# Patient Record
Sex: Female | Born: 1955 | Race: White | Hispanic: No | Smoking: Current every day smoker
Health system: Southern US, Community
[De-identification: ages and names within clinical notes are randomized; demographics above are authoritative.]

## PROBLEM LIST (undated history)

## (undated) DIAGNOSIS — K5792 Diverticulitis of intestine, part unspecified, without perforation or abscess without bleeding: Secondary | ICD-10-CM

## (undated) DIAGNOSIS — I10 Essential (primary) hypertension: Secondary | ICD-10-CM

## (undated) DIAGNOSIS — F32A Depression, unspecified: Secondary | ICD-10-CM

## (undated) DIAGNOSIS — K589 Irritable bowel syndrome without diarrhea: Secondary | ICD-10-CM

## (undated) DIAGNOSIS — J302 Other seasonal allergic rhinitis: Secondary | ICD-10-CM

## (undated) DIAGNOSIS — E785 Hyperlipidemia, unspecified: Secondary | ICD-10-CM

## (undated) DIAGNOSIS — F419 Anxiety disorder, unspecified: Secondary | ICD-10-CM

## (undated) DIAGNOSIS — Z720 Tobacco use: Secondary | ICD-10-CM

## (undated) DIAGNOSIS — I639 Cerebral infarction, unspecified: Secondary | ICD-10-CM

## (undated) DIAGNOSIS — F319 Bipolar disorder, unspecified: Secondary | ICD-10-CM

## (undated) DIAGNOSIS — F29 Unspecified psychosis not due to a substance or known physiological condition: Secondary | ICD-10-CM

## (undated) HISTORY — PX: CHOLECYSTECTOMY: SHX55

## (undated) HISTORY — PX: CARPAL TUNNEL RELEASE: SHX101

---

## 2010-05-20 ENCOUNTER — Emergency Department: Payer: Self-pay | Admitting: Emergency Medicine

## 2015-08-04 ENCOUNTER — Emergency Department
Admission: EM | Admit: 2015-08-04 | Discharge: 2015-08-04 | Disposition: A | Discharge status: Home / Self Care | Payer: Self-pay | Attending: Emergency Medicine | Admitting: Emergency Medicine

## 2015-08-04 ENCOUNTER — Encounter: Payer: Self-pay | Admitting: Emergency Medicine

## 2015-08-04 DIAGNOSIS — W5501XA Bitten by cat, initial encounter: Secondary | ICD-10-CM | POA: Insufficient documentation

## 2015-08-04 DIAGNOSIS — Y9389 Activity, other specified: Secondary | ICD-10-CM | POA: Insufficient documentation

## 2015-08-04 DIAGNOSIS — Y998 Other external cause status: Secondary | ICD-10-CM | POA: Insufficient documentation

## 2015-08-04 DIAGNOSIS — Z23 Encounter for immunization: Secondary | ICD-10-CM | POA: Insufficient documentation

## 2015-08-04 DIAGNOSIS — F17200 Nicotine dependence, unspecified, uncomplicated: Secondary | ICD-10-CM | POA: Insufficient documentation

## 2015-08-04 DIAGNOSIS — Y9289 Other specified places as the place of occurrence of the external cause: Secondary | ICD-10-CM | POA: Insufficient documentation

## 2015-08-04 DIAGNOSIS — S61231A Puncture wound without foreign body of left index finger without damage to nail, initial encounter: Secondary | ICD-10-CM | POA: Insufficient documentation

## 2015-08-04 DIAGNOSIS — S61251A Open bite of left index finger without damage to nail, initial encounter: Secondary | ICD-10-CM | POA: Insufficient documentation

## 2015-08-04 HISTORY — DX: Other seasonal allergic rhinitis: J30.2

## 2015-08-04 HISTORY — DX: Diverticulitis of intestine, part unspecified, without perforation or abscess without bleeding: K57.92

## 2015-08-04 HISTORY — DX: Irritable bowel syndrome, unspecified: K58.9

## 2015-08-04 MED ORDER — AMOXICILLIN-POT CLAVULANATE 875-125 MG PO TABS
1.0000 | ORAL_TABLET | Freq: Once | ORAL | Status: AC
  Administered 2015-08-04: 1 via ORAL
  Filled 2015-08-04: qty 1

## 2015-08-04 MED ORDER — AMOXICILLIN-POT CLAVULANATE 875-125 MG PO TABS: 1.0000 | ORAL_TABLET | Freq: Two times a day (BID) | ORAL | Status: AC

## 2015-08-04 MED ORDER — TETANUS-DIPHTH-ACELL PERTUSSIS 5-2.5-18.5 LF-MCG/0.5 IM SUSP
0.5000 mL | Freq: Once | INTRAMUSCULAR | Status: AC
  Administered 2015-08-04: 0.5 mL via INTRAMUSCULAR
  Filled 2015-08-04: qty 0.5

## 2015-08-04 NOTE — ED Notes (Signed)
[place] [employer] at front desk made aware and will email animal control

## 2015-08-04 NOTE — ED Notes (Signed)
Bite rinsed with NS and covered with gauze.

## 2015-08-04 NOTE — Discharge Instructions (Signed)
Please return if you develop worsened pain, swelling, redness or purulent drainage from your finger.

## 2015-08-04 NOTE — ED Provider Notes (Signed)
[facility] Emergency Department Provider Note  ____________________________________________  Time seen: Approximately 2310 PM  I have reviewed the triage vital signs and the nursing notes.   HISTORY  Chief Complaint Animal Bite    HPI Robin Mosley is a 59 y.o. female who comes into the hospital today with an animal bite. The patient reports that she was bitten by her mother's neighbor's cat. She reports that she has pet the cat in the past but today when she went to pet the cat Bit her finger. She reports that this occurred out of the blue. The patient reports that since it is a pet that she assumes it has all of that shots but she is unsure. The patient reports that she's having some swelling to her left index finger and her pain is a 6 out of 10 in intensity. The patient ran her finger under cold water but did not place any ice on it. The patient's last tetanus was 11-12 years ago and she does not notice any significant swelling. The patient comes in for evaluation and reports that she did speak to the police about the animal.   Past Medical History  Diagnosis Date  . Seasonal allergies   . Diverticulitis   . IBS (irritable bowel syndrome)     There are no active problems to display for this patient.   Past Surgical History  Procedure Laterality Date  . Cesarean section    . Carpal tunnel release    . Cholecystectomy      Current Outpatient Rx  Name  Route  Sig  Dispense  Refill  . amoxicillin-clavulanate (AUGMENTIN) 875-125 MG tablet   Oral   Take 1 tablet by mouth 2 (two) times daily.   20 tablet   0     Allergies Sulfa antibiotics and Tetracyclines & related  History reviewed. No pertinent family history.  Social History Social History  Substance Use Topics  . Smoking status: Current Every Day Smoker  . Smokeless tobacco: None  . Alcohol Use: Yes     Comment: social    Review of Systems Constitutional: No fever/chills Eyes:  No visual changes. ENT: No sore throat. Cardiovascular: Denies chest pain. Respiratory: Denies shortness of breath. Gastrointestinal: No abdominal pain.  No nausea, no vomiting.  No diarrhea.  No constipation. Genitourinary: Negative for dysuria. Musculoskeletal: Finger injury Skin: Negative for rash. Neurological: Negative for headaches, focal weakness or numbness.  10-point ROS otherwise negative.  ____________________________________________   PHYSICAL EXAM:  VITAL SIGNS: ED Triage Vitals  Enc Vitals Group     BP 08/04/15 1924 186/72 mmHg     Pulse Rate 08/04/15 1924 81     Resp 08/04/15 1924 20     Temp 08/04/15 1924 97.8 F (36.6 C)     Temp Source 08/04/15 1924 Oral     SpO2 08/04/15 1924 96 %     Weight 08/04/15 1924 135 lb (61.236 kg)     Height 08/04/15 1924 5\' 2"  (1.575 m)     Head Cir --      Peak Flow --      Pain Score 08/04/15 1925 4     Pain Loc --      Pain Edu? --      Excl. in GC? --     Constitutional: Alert and oriented. Well appearing and in mild distress. Eyes: Conjunctivae are normal. PERRL. EOMI. Head: Atraumatic. Nose: No congestion/rhinnorhea. Mouth/Throat: Mucous membranes are moist.  Oropharynx non-erythematous. Cardiovascular: Normal  rate, regular rhythm. Grossly normal heart sounds.  Good peripheral circulation. Respiratory: Normal respiratory effort.  No retractions. Lungs CTAB. Gastrointestinal: Soft and nontender. No distention. Positive bowel sounds  Musculoskeletal: No lower extremity tenderness nor edema.  Mild swelling to left index finger. Neurologic:  Normal speech and language.  Skin:  Puncture wound noted to left index proximal phalanx Psychiatric: Mood and affect are normal.   ____________________________________________   LABS (all labs ordered are listed, but only abnormal results are displayed)  Labs Reviewed - No data to  display ____________________________________________  EKG  None ____________________________________________  RADIOLOGY  None ____________________________________________   PROCEDURES  Procedure(s) performed: None  Critical Care performed: No  ____________________________________________   INITIAL IMPRESSION / ASSESSMENT AND PLAN / ED COURSE  Pertinent labs & imaging results that were available during my care of the patient were reviewed by me and considered in my medical decision making (see chart for details).  The patient's wound was washed out by the nurse with normal saline. I ordered a T data for the patient as well as some Augmentin. The patient appears well in her finger does not look significantly swollen warm or severely tender. I did inform the patient of return precautions and will give the patient some Augmentin now as well as a prescription for Augmentin. The patient will be discharged home to follow-up with the acute care clinic or return if she should have any worsening symptoms or signs of infection to her finger. The bite did not cross the joint space. ____________________________________________   FINAL CLINICAL IMPRESSION(S) / ED DIAGNOSES  Final diagnoses:  Cat bite, initial encounter      Rebecka Apley, MD 08/05/15 0006

## 2015-08-04 NOTE — ED Notes (Signed)
pt reports being bit by neighbors cat PTA puncture wound to first finger of left hand

## 2015-08-05 NOTE — ED Notes (Signed)
Pt discharged to home.  Pt in NAD.  Discharge instructions reviewed.  No questions or concerns at this time.  No items left in ED.  Teach back verified.  Work note provided.

## 2016-05-01 ENCOUNTER — Emergency Department
Admission: EM | Admit: 2016-05-01 | Discharge: 2016-05-01 | Disposition: A | Discharge status: Home / Self Care | Payer: No Typology Code available for payment source | Attending: Emergency Medicine | Admitting: Emergency Medicine

## 2016-05-01 ENCOUNTER — Emergency Department: Payer: No Typology Code available for payment source

## 2016-05-01 ENCOUNTER — Encounter: Payer: Self-pay | Admitting: Intensive Care

## 2016-05-01 DIAGNOSIS — M549 Dorsalgia, unspecified: Secondary | ICD-10-CM | POA: Diagnosis not present

## 2016-05-01 DIAGNOSIS — Y999 Unspecified external cause status: Secondary | ICD-10-CM | POA: Diagnosis not present

## 2016-05-01 DIAGNOSIS — M1990 Unspecified osteoarthritis, unspecified site: Secondary | ICD-10-CM

## 2016-05-01 DIAGNOSIS — F17200 Nicotine dependence, unspecified, uncomplicated: Secondary | ICD-10-CM | POA: Diagnosis not present

## 2016-05-01 DIAGNOSIS — Y9389 Activity, other specified: Secondary | ICD-10-CM | POA: Diagnosis not present

## 2016-05-01 DIAGNOSIS — Y92410 Unspecified street and highway as the place of occurrence of the external cause: Secondary | ICD-10-CM | POA: Insufficient documentation

## 2016-05-01 DIAGNOSIS — M4692 Unspecified inflammatory spondylopathy, cervical region: Secondary | ICD-10-CM | POA: Insufficient documentation

## 2016-05-01 DIAGNOSIS — R51 Headache: Secondary | ICD-10-CM | POA: Diagnosis present

## 2016-05-01 MED ORDER — CYCLOBENZAPRINE HCL 10 MG PO TABS: 10.0000 mg | ORAL_TABLET | Freq: Three times a day (TID) | ORAL | 0 refills | Status: DC | PRN

## 2016-05-01 MED ORDER — IBUPROFEN 600 MG PO TABS: 600.0000 mg | ORAL_TABLET | Freq: Three times a day (TID) | ORAL | 0 refills | Status: DC | PRN

## 2016-05-01 MED ORDER — OXYCODONE-ACETAMINOPHEN 5-325 MG PO TABS
2.0000 | ORAL_TABLET | Freq: Once | ORAL | Status: AC
  Administered 2016-05-01: 2 via ORAL
  Filled 2016-05-01: qty 2
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
## 2019-03-27 ENCOUNTER — Other Ambulatory Visit: Payer: Self-pay

## 2019-03-27 ENCOUNTER — Encounter: Payer: Self-pay | Admitting: Emergency Medicine

## 2019-03-27 ENCOUNTER — Ambulatory Visit
Admission: EM | Admit: 2019-03-27 | Discharge: 2019-03-27 | Disposition: A | Discharge status: Home / Self Care | Payer: Self-pay | Attending: Family Medicine | Admitting: Family Medicine

## 2019-03-27 DIAGNOSIS — X500XXA Overexertion from strenuous movement or load, initial encounter: Secondary | ICD-10-CM

## 2019-03-27 DIAGNOSIS — M5440 Lumbago with sciatica, unspecified side: Secondary | ICD-10-CM

## 2019-03-27 MED ORDER — TIZANIDINE HCL 4 MG PO TABS: 4.0000 mg | ORAL_TABLET | Freq: Three times a day (TID) | ORAL | 0 refills | Status: DC | PRN

## 2019-03-27 MED ORDER — MELOXICAM 15 MG PO TABS: 15.0000 mg | ORAL_TABLET | Freq: Every day | ORAL | 0 refills | Status: DC | PRN

## 2019-03-27 NOTE — ED Triage Notes (Signed)
Pt c/o right hip pain and radiates down her entire right leg. Started yesterday. She thinks she may have pulled something when she was lifting heavy boxes at work. She states that she could hardly walk this morning.

## 2019-03-27 NOTE — Discharge Instructions (Signed)
Rest.  Ice and Heat.  Medication as prescribed.  Take care  Dr. Ahnika Hannibal  

## 2019-03-29 ENCOUNTER — Telehealth: Payer: Self-pay | Admitting: Emergency Medicine

## 2019-03-29 NOTE — ED Provider Notes (Signed)
[facility]    CSN: [id number] Arrival date & time: 03/27/19  1327  History   Chief Complaint Chief Complaint  Patient presents with  . Hip Pain    right   HPI  63 year old female presents with the above complaint.  Patient reports that she developed what she describes as right hip pain yesterday.  He attributes this to lifting heavy boxes at work.  Patient having difficulty walking secondary to pain.  She currently rates her pain is 4/10 in severity.  Patient reports that the pain radiates down her right leg.  She has taken Motrin without resolution.  No other medications or interventions tried.  Exacerbated by activity.  No other complaints.  PMH, Surgical Hx, Family Hx, Social History reviewed and updated as below.  Past Medical History:  Diagnosis Date  . Diverticulitis   . IBS (irritable bowel syndrome)   . Seasonal allergies    Past Surgical History:  Procedure Laterality Date  . CARPAL TUNNEL RELEASE    . CESAREAN SECTION    . CHOLECYSTECTOMY     OB History   No obstetric history on file.    Home Medications    Prior to Admission medications   Medication Sig Start Date End Date Taking? Authorizing Provider  diphenhydrAMINE (BENADRYL) 25 MG tablet Take 25 mg by mouth every 6 (six) hours as needed.   Yes [provider]  meloxicam (MOBIC) 15 MG tablet Take 1 tablet (15 mg total) by mouth daily as needed for pain. 03/27/19   Coral Spikes, DO  tiZANidine (ZANAFLEX) 4 MG tablet Take 1 tablet (4 mg total) by mouth every 8 (eight) hours as needed for muscle spasms. 03/27/19   Coral Spikes, DO    Family History Family History  Problem Relation Age of Onset  . Heart disease Mother     Social History Social History   Tobacco Use  . Smoking status: Current Every Day Smoker  . Smokeless tobacco: Never Used  Substance Use Topics  . Alcohol use: Yes    Comment: social  . Drug use: No     Allergies   Sulfa antibiotics and Tetracyclines &  related   Review of Systems Review of Systems  Constitutional: Negative.   Musculoskeletal:       Hip pain.   Physical Exam Triage Vital Signs ED Triage Vitals  Enc Vitals Group     BP 03/27/19 1351 (!) 206/92     Pulse Rate 03/27/19 1351 74     Resp 03/27/19 1351 18     Temp 03/27/19 1351 98.1 F (36.7 C)     Temp Source 03/27/19 1351 Oral     SpO2 03/27/19 1351 99 %     Weight 03/27/19 1344 145 lb (65.8 kg)     Height 03/27/19 1344 5\' 2"  (1.575 m)     Head Circumference --      Peak Flow --      Pain Score 03/27/19 1344 4     Pain Loc --      Pain Edu? --      Excl. in [place]? --    Updated Vital Signs BP (!) 203/89 (BP Location: Right Arm)   Pulse 74   Temp 98.1 F (36.7 C) (Oral)   Resp 18   Ht 5\' 2"  (1.575 m)   Wt 65.8 kg   SpO2 99%   BMI 26.52 kg/m   Visual Acuity Right Eye Distance:   Left Eye Distance:  Bilateral Distance:    Right Eye Near:   Left Eye Near:    Bilateral Near:     Physical Exam Vitals signs and nursing note reviewed.  Constitutional:      General: She is not in acute distress.    Appearance: Normal appearance.  Eyes:     General:        Right eye: No discharge.        Left eye: No discharge.     Conjunctiva/sclera: Conjunctivae normal.  Cardiovascular:     Rate and Rhythm: Normal rate and regular rhythm.  Pulmonary:     Effort: Pulmonary effort is normal. No respiratory distress.  Musculoskeletal:     Comments: Exquisite tenderness of the low back/sacrum.    Neurological:     Mental Status: She is alert.  Psychiatric:        Mood and Affect: Mood normal.        Behavior: Behavior normal.    UC Treatments / Results  Labs (all labs ordered are listed, but only abnormal results are displayed) Labs Reviewed - No data to display  EKG   Radiology No results found.  Procedures Procedures (including critical care time)  Medications Ordered in UC Medications - No data to display  Initial Impression / Assessment and  Plan / UC Course  I have reviewed the triage vital signs and the nursing notes.  Pertinent labs & imaging results that were available during my care of the patient were reviewed by me and considered in my medical decision making (see chart for details).    63 year old female presents with low back pain. Treating with Mobic and Zanaflex.  Final Clinical Impressions(s) / UC Diagnoses   Final diagnoses:  Acute bilateral low back pain with sciatica, sciatica laterality unspecified     Discharge Instructions     Rest.  Ice and Heat.  Medication as prescribed.  Take care  Dr. Adriana Simasook    ED Prescriptions    Medication Sig Dispense Auth. Provider   meloxicam (MOBIC) 15 MG tablet Take 1 tablet (15 mg total) by mouth daily as needed for pain. 30 tablet Sheetal Lyall G, DO   tiZANidine (ZANAFLEX) 4 MG tablet Take 1 tablet (4 mg total) by mouth every 8 (eight) hours as needed for muscle spasms. 30 tablet Tommie Samsook, Brallan Denio G, DO     Controlled Substance Prescriptions [place] Controlled Substance Registry consulted? Not Applicable   Tommie SamsCook, Lettie Czarnecki G, DO 03/29/19 0830

## 2019-03-29 NOTE — Telephone Encounter (Signed)
Called to check on patient, no answer, left generic message on machine. nmw

## 2019-03-30 ENCOUNTER — Encounter: Payer: Self-pay | Admitting: Intensive Care

## 2019-03-30 ENCOUNTER — Emergency Department: Payer: Self-pay

## 2019-03-30 ENCOUNTER — Emergency Department
Admission: EM | Admit: 2019-03-30 | Discharge: 2019-03-30 | Disposition: A | Discharge status: Home / Self Care | Payer: Self-pay | Attending: Emergency Medicine | Admitting: Emergency Medicine

## 2019-03-30 ENCOUNTER — Other Ambulatory Visit: Payer: Self-pay

## 2019-03-30 DIAGNOSIS — M5441 Lumbago with sciatica, right side: Secondary | ICD-10-CM | POA: Insufficient documentation

## 2019-03-30 DIAGNOSIS — F17210 Nicotine dependence, cigarettes, uncomplicated: Secondary | ICD-10-CM | POA: Insufficient documentation

## 2019-03-30 LAB — URINALYSIS, COMPLETE (UACMP) WITH MICROSCOPIC
Bacteria, UA: NONE SEEN
Bilirubin Urine: NEGATIVE
Glucose, UA: NEGATIVE mg/dL
Ketones, ur: NEGATIVE mg/dL
Leukocytes,Ua: NEGATIVE
Nitrite: NEGATIVE
Protein, ur: NEGATIVE mg/dL
Specific Gravity, Urine: 1.02 (ref 1.005–1.030)
pH: 5 (ref 5.0–8.0)

## 2019-03-30 MED ORDER — HYDROCODONE-ACETAMINOPHEN 5-325 MG PO TABS: 1.0000 | ORAL_TABLET | Freq: Four times a day (QID) | ORAL | 0 refills | Status: AC | PRN

## 2019-03-30 MED ORDER — PREDNISONE 20 MG PO TABS
60.0000 mg | ORAL_TABLET | Freq: Once | ORAL | Status: AC
  Administered 2019-03-30: 60 mg via ORAL
  Filled 2019-03-30: qty 3

## 2019-03-30 MED ORDER — ONDANSETRON 4 MG PO TBDP
4.0000 mg | ORAL_TABLET | Freq: Once | ORAL | Status: AC
  Administered 2019-03-30: 4 mg via ORAL
  Filled 2019-03-30: qty 1

## 2019-03-30 MED ORDER — PREDNISONE 10 MG (21) PO TBPK: ORAL_TABLET | ORAL | 0 refills | Status: DC

## 2019-03-30 MED ORDER — OXYCODONE-ACETAMINOPHEN 5-325 MG PO TABS
1.0000 | ORAL_TABLET | Freq: Once | ORAL | Status: AC
  Administered 2019-03-30: 1 via ORAL
  Filled 2019-03-30: qty 1

## 2019-03-30 NOTE — Discharge Instructions (Signed)
Take prednisone daily for the next 12 days.  You will start with 6 tablets that you take for the first 2 days.  You will decrease by 1 tablet every 2 days until medication runs out. You can also take Norco for pain. If symptoms persist, please follow-up with neurosurgery, Dr. Cari Caraway.

## 2019-03-30 NOTE — ED Provider Notes (Signed)
[facility] Emergency Department Provider Note  ____________________________________________  Time seen: Approximately 4:47 PM  I have reviewed the triage vital signs and the nursing notes.   HISTORY  Chief Complaint Hip Pain (right) and Back Pain (right)    HPI Robin Mosley is a 63 y.o. female presents to the emergency department with right-sided low back pain that radiates down the right lower extremity to the right foot since patient engaged in some heavy lifting at work.  Patient reports that she was carrying some boxes and could not lift from her knees.  Patient states that later that night, she felt like she could not walk upright.  She denies leg weakness, bowel or bladder incontinence or saddle anesthesia.  She denies a history of chronic low back pain and denies similar injuries in the past.  She was seen and evaluated by med in urgent care and was prescribed denies edema and meloxicam.  Patient reports that she has been taking medications as directed but has not experienced significant improvement.  She denies dysuria, hematuria or increased urinary frequency.  No falls.  No fever at home or use of illicit drugs.        Past Medical History:  Diagnosis Date  . Diverticulitis   . IBS (irritable bowel syndrome)   . Seasonal allergies     There are no active problems to display for this patient.   Past Surgical History:  Procedure Laterality Date  . CARPAL TUNNEL RELEASE    . CESAREAN SECTION    . CHOLECYSTECTOMY      Prior to Admission medications   Medication Sig Start Date End Date Taking? Authorizing Provider  diphenhydrAMINE (BENADRYL) 25 MG tablet Take 25 mg by mouth every 6 (six) hours as needed.    [provider]  HYDROcodone-acetaminophen (NORCO) 5-325 MG tablet Take 1 tablet by mouth every 6 (six) hours as needed for up to 3 days for moderate pain. 03/30/19 04/02/19  Orvil FeilWoods, Rosser Collington M, PA-C  meloxicam (MOBIC) 15 MG tablet Take  1 tablet (15 mg total) by mouth daily as needed for pain. 03/27/19   Tommie Samsook, Jayce G, DO  predniSONE (STERAPRED UNI-PAK 21 TAB) 10 MG (21) TBPK tablet Take 6 tablets daily for the first 2 days.  Take one last tablet every 2 days until medication runs out. 03/30/19   Orvil FeilWoods, Kaulin Chaves M, PA-C  tiZANidine (ZANAFLEX) 4 MG tablet Take 1 tablet (4 mg total) by mouth every 8 (eight) hours as needed for muscle spasms. 03/27/19   Tommie Samsook, Jayce G, DO    Allergies Sulfa antibiotics and Tetracyclines & related  Family History  Problem Relation Age of Onset  . Heart disease Mother     Social History Social History   Tobacco Use  . Smoking status: Current Every Day Smoker    Types: Cigarettes  . Smokeless tobacco: Never Used  Substance Use Topics  . Alcohol use: Yes    Comment: social  . Drug use: No     Review of Systems  Constitutional: No fever/chills Eyes: No visual changes. No discharge ENT: No upper respiratory complaints. Cardiovascular: no chest pain. Respiratory: no cough. No SOB. Gastrointestinal: No abdominal pain.  No nausea, no vomiting.  No diarrhea.  No constipation. Genitourinary: Negative for dysuria. No hematuria Musculoskeletal: Patient has low back pain.  Skin: Negative for rash, abrasions, lacerations, ecchymosis. Neurological: Negative for headaches, focal weakness or numbness.   ____________________________________________   PHYSICAL EXAM:  VITAL SIGNS: ED Triage Vitals  Enc Vitals Group     BP 03/30/19 1531 (!) 197/89     Pulse Rate 03/30/19 1529 86     Resp 03/30/19 1529 16     Temp 03/30/19 1529 98.9 F (37.2 C)     Temp Source 03/30/19 1529 Oral     SpO2 03/30/19 1529 95 %     Weight 03/30/19 1530 150 lb (68 kg)     Height 03/30/19 1530 5\' 2"  (1.575 m)     Head Circumference --      Peak Flow --      Pain Score 03/30/19 1530 8     Pain Loc --      Pain Edu? --      Excl. in [place]? --      Constitutional: Alert and oriented. Well appearing and in no  acute distress. Eyes: Conjunctivae are normal. PERRL. EOMI. Head: Atraumatic. Cardiovascular: Normal rate, regular rhythm. Normal S1 and S2.  Good peripheral circulation. Respiratory: Normal respiratory effort without tachypnea or retractions. Lungs CTAB. Good air entry to the bases with no decreased or absent breath sounds. Gastrointestinal: Bowel sounds 4 quadrants. Soft and nontender to palpation. No guarding or rigidity. No palpable masses. No distention. No CVA tenderness. Musculoskeletal: 5 out of 5 strength of the lower extremities bilaterally and symmetrically.  Full range of motion to all extremities. No gross deformities appreciated.  Patient has a positive straight leg raise test on the right.  Patient has some paraspinal muscle tenderness on the right as well. Neurologic:  Normal speech and language. No gross focal neurologic deficits are appreciated.  Skin:  Skin is warm, dry and intact. No rash noted. Psychiatric: Mood and affect are normal. Speech and behavior are normal. Patient exhibits appropriate insight and judgement.   ____________________________________________   LABS (all labs ordered are listed, but only abnormal results are displayed)  Labs Reviewed  URINALYSIS, COMPLETE (UACMP) WITH MICROSCOPIC - Abnormal; Notable for the following components:      Result Value   Color, Urine YELLOW (*)    APPearance CLEAR (*)    Hgb urine dipstick MODERATE (*)    All other components within normal limits   ____________________________________________  EKG   ____________________________________________  RADIOLOGY I personally viewed and evaluated these images as part of my medical decision making, as well as reviewing the written report by the radiologist.  Dg Lumbar Spine 2-3 Views  Result Date: 03/30/2019 CLINICAL DATA:  Lower back pain. EXAM: LUMBAR SPINE - 2-3 VIEW COMPARISON:  05/01/2016 FINDINGS: Normal alignment of the lumbar spine. The vertebral body heights  and disc spaces are maintained. Degenerative endplate changes particularly along the anterior aspect L2, L3 and L4. Evidence for facet arthropathy. IMPRESSION: No acute bone abnormality to the lumbar spine. Stable degenerative changes. Electronically Signed   By: Markus Daft M.D.   On: 03/30/2019 16:41    ____________________________________________    PROCEDURES  Procedure(s) performed:    Procedures    Medications  oxyCODONE-acetaminophen (PERCOCET/ROXICET) 5-325 MG per tablet 1 tablet (1 tablet Oral Given 03/30/19 1636)  ondansetron (ZOFRAN-ODT) disintegrating tablet 4 mg (4 mg Oral Given 03/30/19 1636)  predniSONE (DELTASONE) tablet 60 mg (60 mg Oral Given 03/30/19 1636)     ____________________________________________   INITIAL IMPRESSION / ASSESSMENT AND PLAN / ED COURSE  Pertinent labs & imaging results that were available during my care of the patient were reviewed by me and considered in my medical decision making (see chart for details).  Review of the [place]  CSRS was performed in accordance of the [facility] prior to dispensing any controlled drugs.         Assessment and plan 63 year old female presents to the emergency department with right-sided low back pain that radiates down the right lower extremity.  Patient was hypertensive at triage but vital signs were otherwise reassuring.  Neuro exam was noncontributory for acute weakness.  Patient had normal sensation and overall neuro exam was within the parameters of normal.  Differential diagnosis includes compression fracture, sciatica, disc extrusion, cystitis...  Urinalysis was noncontributory for cystitis.  X-ray examination of the lumbar spine revealed no compression fractures or other acute bony abnormalities.  Patient was given Percocet and prednisone in the emergency department patient reports that her pain improved by 50%.  Patient was discharged with tapered prednisone and Norco.  She was also given a referral to  neurosurgery.  All patient questions were answered.    ____________________________________________  FINAL CLINICAL IMPRESSION(S) / ED DIAGNOSES  Final diagnoses:  Acute right-sided low back pain with right-sided sciatica      NEW MEDICATIONS STARTED DURING THIS VISIT:  ED Discharge Orders         Ordered    predniSONE (STERAPRED UNI-PAK 21 TAB) 10 MG (21) TBPK tablet     03/30/19 1839    HYDROcodone-acetaminophen (NORCO) 5-325 MG tablet  Every 6 hours PRN     03/30/19 1839              This chart was dictated using voice recognition software/Dragon. Despite best efforts to proofread, errors can occur which can change the meaning. Any change was purely unintentional.    Orvil FeilWoods, Beauty Pless M, PA-C 03/30/19 1901    Concha SeFunke, Mary E, MD 03/31/19 0111

## 2019-03-30 NOTE — ED Notes (Signed)
Patient transported to X-ray 

## 2019-03-30 NOTE — ED Triage Notes (Signed)
Patient c/o right lower back pain that radiates down hip and to foot. Denies injury. Was seen at [facility] urgent care 8/17 for same. Patient reports she lifts heavy items at work. HEre today due to continued pain with no relief

## 2019-04-20 ENCOUNTER — Other Ambulatory Visit: Payer: Self-pay | Admitting: Student

## 2019-04-20 DIAGNOSIS — M5416 Radiculopathy, lumbar region: Secondary | ICD-10-CM

## 2019-04-23 ENCOUNTER — Other Ambulatory Visit: Payer: Self-pay | Admitting: Family Medicine

## 2019-05-22 ENCOUNTER — Other Ambulatory Visit: Payer: Self-pay

## 2019-10-05 ENCOUNTER — Inpatient Hospital Stay: Payer: Self-pay

## 2019-10-05 ENCOUNTER — Encounter: Payer: Self-pay | Admitting: Emergency Medicine

## 2019-10-05 ENCOUNTER — Emergency Department: Payer: Self-pay

## 2019-10-05 ENCOUNTER — Inpatient Hospital Stay
Admit: 2019-10-05 | Discharge: 2019-10-05 | Disposition: A | Discharge status: Home / Self Care | Payer: Self-pay | Attending: Family Medicine | Admitting: Family Medicine

## 2019-10-05 ENCOUNTER — Other Ambulatory Visit: Payer: Self-pay

## 2019-10-05 ENCOUNTER — Inpatient Hospital Stay
Admission: EM | Admit: 2019-10-05 | Discharge: 2019-10-07 | DRG: 065 | Disposition: A | Discharge status: Home Health | Payer: Self-pay | Attending: Family Medicine | Admitting: Family Medicine

## 2019-10-05 DIAGNOSIS — I639 Cerebral infarction, unspecified: Secondary | ICD-10-CM | POA: Diagnosis present

## 2019-10-05 DIAGNOSIS — R29708 NIHSS score 8: Secondary | ICD-10-CM | POA: Diagnosis present

## 2019-10-05 DIAGNOSIS — Z79899 Other long term (current) drug therapy: Secondary | ICD-10-CM

## 2019-10-05 DIAGNOSIS — I63512 Cerebral infarction due to unspecified occlusion or stenosis of left middle cerebral artery: Principal | ICD-10-CM | POA: Diagnosis present

## 2019-10-05 DIAGNOSIS — I429 Cardiomyopathy, unspecified: Secondary | ICD-10-CM | POA: Diagnosis present

## 2019-10-05 DIAGNOSIS — Z720 Tobacco use: Secondary | ICD-10-CM

## 2019-10-05 DIAGNOSIS — F17210 Nicotine dependence, cigarettes, uncomplicated: Secondary | ICD-10-CM | POA: Diagnosis present

## 2019-10-05 DIAGNOSIS — Z888 Allergy status to other drugs, medicaments and biological substances status: Secondary | ICD-10-CM

## 2019-10-05 DIAGNOSIS — I10 Essential (primary) hypertension: Secondary | ICD-10-CM | POA: Diagnosis present

## 2019-10-05 DIAGNOSIS — E876 Hypokalemia: Secondary | ICD-10-CM

## 2019-10-05 DIAGNOSIS — Z882 Allergy status to sulfonamides status: Secondary | ICD-10-CM

## 2019-10-05 DIAGNOSIS — R4182 Altered mental status, unspecified: Secondary | ICD-10-CM

## 2019-10-05 DIAGNOSIS — Z91048 Other nonmedicinal substance allergy status: Secondary | ICD-10-CM

## 2019-10-05 DIAGNOSIS — R410 Disorientation, unspecified: Secondary | ICD-10-CM

## 2019-10-05 DIAGNOSIS — K589 Irritable bowel syndrome without diarrhea: Secondary | ICD-10-CM | POA: Diagnosis present

## 2019-10-05 DIAGNOSIS — Z20822 Contact with and (suspected) exposure to covid-19: Secondary | ICD-10-CM | POA: Diagnosis present

## 2019-10-05 DIAGNOSIS — I69351 Hemiplegia and hemiparesis following cerebral infarction affecting right dominant side: Secondary | ICD-10-CM

## 2019-10-05 DIAGNOSIS — R531 Weakness: Secondary | ICD-10-CM

## 2019-10-05 DIAGNOSIS — I4891 Unspecified atrial fibrillation: Secondary | ICD-10-CM | POA: Diagnosis present

## 2019-10-05 DIAGNOSIS — I69320 Aphasia following cerebral infarction: Secondary | ICD-10-CM

## 2019-10-05 LAB — URINE DRUG SCREEN, QUALITATIVE (ARMC ONLY)
Amphetamines, Ur Screen: NOT DETECTED
Barbiturates, Ur Screen: NOT DETECTED
Benzodiazepine, Ur Scrn: NOT DETECTED
Cannabinoid 50 Ng, Ur ~~LOC~~: POSITIVE — AB
Cocaine Metabolite,Ur ~~LOC~~: NOT DETECTED
MDMA (Ecstasy)Ur Screen: NOT DETECTED
Methadone Scn, Ur: NOT DETECTED
Opiate, Ur Screen: NOT DETECTED
Phencyclidine (PCP) Ur S: NOT DETECTED
Tricyclic, Ur Screen: NOT DETECTED

## 2019-10-05 LAB — ETHANOL: Alcohol, Ethyl (B): <10 mg/dL (ref <10)

## 2019-10-05 LAB — COMPREHENSIVE METABOLIC PANEL
ALT: 13 U/L (ref 0–44)
AST: 16 U/L (ref 15–41)
Albumin: 3.9 g/dL (ref 3.5–5.0)
Alkaline Phosphatase: 81 U/L (ref 38–126)
Anion gap: 9 (ref 5–15)
BUN: 11 mg/dL (ref 8–23)
CO2: 28 mmol/L (ref 22–32)
Calcium: 8.7 mg/dL — ABNORMAL LOW (ref 8.9–10.3)
Chloride: 105 mmol/L (ref 98–111)
Creatinine, Ser: 0.67 mg/dL (ref 0.44–1.00)
GFR calc Af Amer: >60 mL/min (ref >60)
GFR calc non Af Amer: >60 mL/min (ref >60)
Glucose, Bld: 100 mg/dL — ABNORMAL HIGH (ref 70–99)
Potassium: 3.3 mmol/L — ABNORMAL LOW (ref 3.5–5.1)
Sodium: 142 mmol/L (ref 135–145)
Total Bilirubin: 0.4 mg/dL (ref 0.3–1.2)
Total Protein: 7.5 g/dL (ref 6.5–8.1)

## 2019-10-05 LAB — DIFFERENTIAL
Abs Immature Granulocytes: 0.02 10*3/uL (ref 0.00–0.07)
Basophils Absolute: 0 10*3/uL (ref 0.0–0.1)
Basophils Relative: 1 %
Eosinophils Absolute: 0.2 10*3/uL (ref 0.0–0.5)
Eosinophils Relative: 2 %
Immature Granulocytes: 0 %
Lymphocytes Relative: 21 %
Lymphs Abs: 1.7 10*3/uL (ref 0.7–4.0)
Monocytes Absolute: 0.6 10*3/uL (ref 0.1–1.0)
Monocytes Relative: 8 %
Neutro Abs: 5.3 10*3/uL (ref 1.7–7.7)
Neutrophils Relative %: 68 %

## 2019-10-05 LAB — RESPIRATORY PANEL BY RT PCR (FLU A&B, COVID)
Influenza A by PCR: NEGATIVE
Influenza B by PCR: NEGATIVE
SARS Coronavirus 2 by RT PCR: NEGATIVE

## 2019-10-05 LAB — LIPID PANEL
Cholesterol: 224 mg/dL — ABNORMAL HIGH (ref 0–200)
HDL: 52 mg/dL (ref >40)
LDL Cholesterol: 150 mg/dL — ABNORMAL HIGH (ref 0–99)
Total CHOL/HDL Ratio: 4.3 RATIO
Triglycerides: 112 mg/dL (ref <150)
VLDL: 22 mg/dL (ref 0–40)

## 2019-10-05 LAB — URINALYSIS, ROUTINE W REFLEX MICROSCOPIC
Bacteria, UA: NONE SEEN
Bilirubin Urine: NEGATIVE
Glucose, UA: NEGATIVE mg/dL
Ketones, ur: NEGATIVE mg/dL
Leukocytes,Ua: NEGATIVE
Nitrite: NEGATIVE
Protein, ur: NEGATIVE mg/dL
Specific Gravity, Urine: 1.019 (ref 1.005–1.030)
pH: 5 (ref 5.0–8.0)

## 2019-10-05 LAB — CBC
HCT: 40.3 % (ref 36.0–46.0)
Hemoglobin: 13.4 g/dL (ref 12.0–15.0)
MCH: 28.3 pg (ref 26.0–34.0)
MCHC: 33.3 g/dL (ref 30.0–36.0)
MCV: 85 fL (ref 80.0–100.0)
Platelets: 292 10*3/uL (ref 150–400)
RBC: 4.74 MIL/uL (ref 3.87–5.11)
RDW: 13.2 % (ref 11.5–15.5)
WBC: 7.8 10*3/uL (ref 4.0–10.5)
nRBC: 0 % (ref 0.0–0.2)

## 2019-10-05 LAB — PROTIME-INR
INR: 0.9 (ref 0.8–1.2)
Prothrombin Time: 12.1 seconds (ref 11.4–15.2)

## 2019-10-05 LAB — HEMOGLOBIN A1C
Hgb A1c MFr Bld: 5.5 % (ref 4.8–5.6)
Mean Plasma Glucose: 111.15 mg/dL

## 2019-10-05 LAB — APTT: aPTT: 36 seconds (ref 24–36)

## 2019-10-05 LAB — MAGNESIUM: Magnesium: 2 mg/dL (ref 1.7–2.4)

## 2019-10-05 MED ORDER — POTASSIUM CHLORIDE 20 MEQ PO PACK
40.0000 meq | PACK | Freq: Once | ORAL | Status: AC
  Administered 2019-10-05: 13:00:00 40 meq via ORAL
  Filled 2019-10-05: qty 2

## 2019-10-05 MED ORDER — ACETAMINOPHEN 160 MG/5ML PO SOLN
650.0000 mg | ORAL | Status: DC | PRN
  Filled 2019-10-05: qty 20.3

## 2019-10-05 MED ORDER — POTASSIUM CHLORIDE CRYS ER 20 MEQ PO TBCR
40.0000 meq | EXTENDED_RELEASE_TABLET | Freq: Once | ORAL | Status: AC
  Administered 2019-10-05: 18:00:00 40 meq via ORAL
  Filled 2019-10-05: qty 2

## 2019-10-05 MED ORDER — ASPIRIN 300 MG RE SUPP
300.0000 mg | Freq: Every day | RECTAL | Status: DC
  Filled 2019-10-05 (×3): qty 1

## 2019-10-05 MED ORDER — SIMVASTATIN 20 MG PO TABS: 20.0000 mg | ORAL_TABLET | Freq: Every day | ORAL | Status: DC

## 2019-10-05 MED ORDER — SODIUM CHLORIDE 0.9 % IV SOLN: INTRAVENOUS | Status: DC

## 2019-10-05 MED ORDER — ROSUVASTATIN CALCIUM 20 MG PO TABS
40.0000 mg | ORAL_TABLET | Freq: Every day | ORAL | Status: DC
  Administered 2019-10-05 – 2019-10-06 (×2): 40 mg via ORAL
  Filled 2019-10-05: qty 2
  Filled 2019-10-05: qty 4
  Filled 2019-10-05: qty 2
  Filled 2019-10-05: qty 4
  Filled 2019-10-05: qty 2

## 2019-10-05 MED ORDER — ASPIRIN 81 MG PO CHEW
324.0000 mg | CHEWABLE_TABLET | Freq: Once | ORAL | Status: AC
  Administered 2019-10-05: 324 mg via ORAL
  Filled 2019-10-05: qty 4

## 2019-10-05 MED ORDER — ENOXAPARIN SODIUM 40 MG/0.4ML ~~LOC~~ SOLN
40.0000 mg | SUBCUTANEOUS | Status: DC
  Administered 2019-10-05 – 2019-10-06 (×2): 40 mg via SUBCUTANEOUS
  Filled 2019-10-05 (×2): qty 0.4

## 2019-10-05 MED ORDER — ACETAMINOPHEN 650 MG RE SUPP: 650.0000 mg | RECTAL | Status: DC | PRN

## 2019-10-05 MED ORDER — IOHEXOL 350 MG/ML SOLN
100.0000 mL | Freq: Once | INTRAVENOUS | Status: AC | PRN
  Administered 2019-10-05: 09:00:00 100 mL via INTRAVENOUS

## 2019-10-05 MED ORDER — SENNOSIDES-DOCUSATE SODIUM 8.6-50 MG PO TABS: 1.0000 | ORAL_TABLET | Freq: Every evening | ORAL | Status: DC | PRN

## 2019-10-05 MED ORDER — STROKE: EARLY STAGES OF RECOVERY BOOK: Freq: Once | Status: AC

## 2019-10-05 MED ORDER — ACETAMINOPHEN 325 MG PO TABS: 650.0000 mg | ORAL_TABLET | ORAL | Status: DC | PRN

## 2019-10-05 MED ORDER — ASPIRIN EC 325 MG PO TBEC
325.0000 mg | DELAYED_RELEASE_TABLET | Freq: Every day | ORAL | Status: DC
  Administered 2019-10-05 – 2019-10-07 (×3): 325 mg via ORAL
  Filled 2019-10-05 (×3): qty 1

## 2019-10-05 NOTE — Progress Notes (Addendum)
SLP Cancellation Note  Patient Details Name: Robin Mosley MRN: [id number] DOB: [birth year]   Cancelled treatment:       Reason Eval/Treat Not Completed: Patient not medically ready(chart reviewed; consulted NSG re: pt's status). Reviewed pt's chart and consulted NSG. Pt is not yet 12 hours post stroke s/s at home; admitted less than 8-9 hours. Noted chart notes indicating: "acute left MCA changes and MRI brain confirmed large infarct in the MCA territory". Pt is exhibiting some expressive language deficits currently.  Met w/ pt and discussed the role of Speech Therapy; probable f/u at Discharge w/ ST services. Briefly discussed strategy of slowing down when trying to say something; relaxing. NSG reported pt passed the Yale swallow screen and is on an oral diet. ST services will f/u tomorrow (givning pt 24 hours) w/ Language assessment at bedside. NSG agreed.     Orinda Kenner, MS, CCC-SLP Krishna Heuer 10/05/2019, 12:07 PM

## 2019-10-05 NOTE — Progress Notes (Signed)
OT Cancellation Note  Patient Details Name: Robin Mosley MRN: [id number] DOB: 30-Oct-1955   Cancelled Treatment:    Reason Eval/Treat Not Completed: Patient at procedure or test/ unavailable- Thank you for OT consult.  Order received and chart reviewed.  OT attempted to see pt for evaluation this date.  Upon arrival to room, pt noted to be off unit for ultrasound.  Will reattempt as available and pt medically appropriate for therapy.  Kathyrn Drown, OTR/L 10/05/19, 10:10 AM

## 2019-10-05 NOTE — Evaluation (Signed)
Occupational Therapy Evaluation Patient Details Name: Robin Mosley MRN: [id number] DOB: [birth year] Today's Date: 10/05/2019    History of Present Illness Robin Mosley is a 64 y.o. female with PMH of devrticulitis, IBS presents to the ED with altered mental status when family woke her up at 3AM [place] to the airport.  Patient's last known normal was 9 PM when she went to sleep.  When family went to get her up, they found her wandering in the house acting very confused and not making sense while speaking.  Patient was brought to [facility]. ED courseA stat CT head was performed which showed early acute ischemic stroke changes in the left MCA territory.  An MRI brain was subsequently performed which showed a large left MCA infarct.  Patient was admitted to hospital service and started on aspirin.  She was outside the window for receiving TPA.   Clinical Impression   Pt seen for OT evaluation this date. Prior to hospital admission, pt was independent in all aspects of ADL. Pt was not currently working prior to admission. Pt lives alone in a two-level home with L hand rail on steps inside home. Currently pt demonstrates mild impairments in RUE strength, coordination, and sensation. Pt is L hand dominant and requires min assist for assist for seated ADL and CGA for transfers due to decreased safety awareness and balance. Pt is eager to return to her PLOF with increased independence and functional use of her right arm.  Pt also presents with global aphasia, primarily difficulties with expression.  She also presents with apraxia. No visual deficits observed.  Pt would benefit from skilled OT to address noted impairments and functional limitations (see below for any additional details) in order to maximize safety and independence while minimizing falls risk and caregiver burden.  Upon hospital discharge, recommend pt discharge with home health OT and 24 hours supervision to maximize safety  and return to PLOF.    Follow Up Recommendations  Home health OT;Supervision/Assistance - 24 hour    Equipment Recommendations       Recommendations for Other Services       Precautions / Restrictions Precautions Precautions: Fall Restrictions Weight Bearing Restrictions: No      Mobility Bed Mobility Overal bed mobility: Independent                Transfers Overall transfer level: Needs assistance   Transfers: Sit to/from Stand Sit to Stand: Min guard         General transfer comment: CGA without AD, pt slightly impulsive with transfers and functional mobility    Balance Overall balance assessment: Needs assistance Sitting-balance support: No upper extremity supported;Feet unsupported Sitting balance-Leahy Scale: Good Sitting balance - Comments: one LOB sitting EOB, pt quickly recovered   Standing balance support: Single extremity supported Standing balance-Leahy Scale: Good Standing balance comment: pt holding onto furniture or grab bars when available, needs CGA                           ADL either performed or assessed with clinical judgement   ADL Overall ADL's : Needs assistance/impaired                                       General ADL Comments: Pt unable to doff/don socks- difficult to assess 2/2 pt aphasia, but suspect was  2/2 apraxia difficulties with following verbal commands and visual demonstration.  Pt brushed teeth with CGA standing at sink, CGA for toileting in bathroom with pt's use of grab bars     Vision Baseline Vision/History: Wears glasses Wears Glasses: At all times(pt does not have glasses in hospital) Vision Assessment?: Yes Eye Alignment: Within Functional Limits Ocular Range of Motion: Within Functional Limits Alignment/Gaze Preference: Within Defined Limits Tracking/Visual Pursuits: Able to track stimulus in all quads without difficulty Saccades: Within functional limits Convergence: Within  functional limits Visual Fields: No apparent deficits     Sales executive Comments: pt with difficulty identifying correct item to wash her hands with (lotion vs. soap vs. shampoo) after OTR read items aloud, pt unable to complete figure 4 position or reaching to don/doff socks seated EOB after verbal cues and visual demonstration    Pertinent Vitals/Pain Pain Assessment: Faces Pain Location: Pt denies pain, but continually touching R eye/forehead with hand and grimacing during eval Pain Intervention(s): Monitored during session     Hand Dominance Left   Extremity/Trunk Assessment Upper Extremity Assessment Upper Extremity Assessment: RUE deficits/detail RUE Deficits / Details: Decreased strength in RUE (3+/5) vs. LUE.  Pt attempted to complete finger-thumb opposition with RUE with increased time, but was only able to oppose digits 2&3 with thumb.  Pt able to use B hands to complete functional tasks. RUE Sensation: decreased light touch(difficult to asses 2/2 pt aphasia, but suspect some sensation deficits in R hand) RUE Coordination: decreased fine motor   Lower Extremity Assessment Lower Extremity Assessment: Overall WFL for tasks assessed       Communication Communication Communication: Expressive difficulties(pt with expressive aphasia, attempts to write on paper but has dificulty expressing via paper as well.  Possibly some receptive difficulties as well.)   Cognition Arousal/Alertness: Awake/alert Behavior During Therapy: Impulsive Overall Cognitive Status: Impaired/Different from baseline Area of Impairment: Following commands;Safety/judgement                       Following Commands: Follows one step commands inconsistently Safety/Judgement: Decreased awareness of safety;Decreased awareness of deficits     General Comments: Pt with intermittent difficulty following one step command, was impulsive in functional ambulation and  transfers after verbal cues for safety from OTR   General Comments       Exercises Other Exercises Other Exercises: provided education regarding safety and fall precautions, self care, functional transfers and mobility, communication strategies, and OT role and POC   Shoulder Instructions      Home Living Family/patient expects to be discharged to:: Private residence Living Arrangements: Children Available Help at Discharge: Family;Friend(s);Neighbor Type of Home: House Home Access: Stairs to enter(unsure of stairs or railings outside home 2/2 pt aphasia)     Home Layout: Two level;Bed/bath upstairs   Alternate Level Stairs-Rails: Left Bathroom Shower/Tub: Chief Strategy Officer: Standard                Prior Functioning/Environment Level of Independence: Independent                 OT Problem List: Decreased strength;Decreased range of motion;Impaired balance (sitting and/or standing);Impaired vision/perception;Decreased coordination;Decreased cognition;Decreased safety awareness;Decreased knowledge of use of DME or AE;Decreased knowledge of precautions;Impaired sensation;Impaired UE functional use      OT Treatment/Interventions: Self-care/ADL training;Therapeutic activities;Therapeutic exercise;Neuromuscular education;Cognitive remediation/compensation;Visual/perceptual remediation/compensation;DME and/or AE instruction;Patient/family education;Balance training    OT Goals(Current goals can be found in the care  plan section) Acute Rehab OT Goals Patient Stated Goal: none reported OT Goal Formulation: With patient Time For Goal Achievement: 10/19/19 Potential to Achieve Goals: Good ADL Goals Pt Will Perform Upper Body Dressing: with min guard assist Pt Will Perform Lower Body Dressing: with min guard assist Additional ADL Goal #1: Pt will independently verbalize 3 strategies for falls prevention to optimize safety during functional mobility and ADL   OT Frequency: Min 2X/week   Barriers to D/C: Inaccessible home environment          Co-evaluation              AM-PAC OT "6 Clicks" Daily Activity     Outcome Measure Help from another person eating meals?: A Little Help from another person taking care of personal grooming?: A Little Help from another person toileting, which includes using toliet, bedpan, or urinal?: A Little Help from another person bathing (including washing, rinsing, drying)?: A Little Help from another person to put on and taking off regular upper body clothing?: A Little Help from another person to put on and taking off regular lower body clothing?: A Little 6 Click Score: 18   End of Session Equipment Utilized During Treatment: Gait belt  Activity Tolerance: Patient tolerated treatment well Patient left: in bed;with call bell/phone within reach;with bed alarm set  OT Visit Diagnosis: Other abnormalities of gait and mobility (R26.89);Muscle weakness (generalized) (M62.81);Apraxia (R48.2);Cognitive communication deficit (R41.841);Hemiplegia and hemiparesis Symptoms and signs involving cognitive functions: Cerebral infarction Hemiplegia - Right/Left: Right Hemiplegia - dominant/non-dominant: Non-Dominant Hemiplegia - caused by: Cerebral infarction                Time: 6503-5465 OT Time Calculation (min): 62 min Charges:  OT General Charges $OT Visit: 1 Visit OT Evaluation $OT Eval Moderate Complexity: 1 Mod OT Treatments $Self Care/Home Management : 23-37 mins $Cognitive Funtion inital: Initial 15 mins  Kathyrn Drown, OTR/L 10/05/19, 3:02 PM

## 2019-10-05 NOTE — Consult Note (Signed)
Requesting Physician: Dr. Maryfrances Bunnellanford    Chief Complaint: Aphasia, altered mental status  History obtained from: Patient and Chart     HPI:                                                                                                                                       Robin Mosley is a 64 y.o. female with PMH of devrticulitis, IBS presents to the ED with altered mental status when family woke her up at 3AM to take her to the airport.   Patient's last known normal was 9 PM when she went to sleep.  When family went to get her up, they found her wandering in the house acting very confused and not making sense while speaking.  Patient was brought to [facility].  ED course A stat CT head was performed which showed early acute ischemic stroke changes in the left MCA territory.  An MRI brain was subsequently performed which showed a large left MCA infarct.  Patient was admitted to hospital service and started on aspirin.  She was outside the window for receiving TPA.  Her UDS was positive for cannabis.  Neurology was consulted this morning, due to concern for large vessel occlusion within 24 hours-CT angiogram was performed which confirmed a left M2 occlusion.  CT perfusion showed pseudonormalization of core, no penumbra.  Patient not a candidate for mechanical thrombectomy    Date last known well: 2.24.21 Time last known well: 9pm tPA Given: No outside window NIHSS: 9  Baseline MRS 0   Past Medical History:  Diagnosis Date  . Diverticulitis   . IBS (irritable bowel syndrome)   . Seasonal allergies     Past Surgical History:  Procedure Laterality Date  . CARPAL TUNNEL RELEASE    . CESAREAN SECTION    . CHOLECYSTECTOMY      Family History  Problem Relation Age of Onset  . Heart disease Mother    Social History:  reports that she has been smoking cigarettes. She has never used smokeless tobacco. She reports current alcohol use. She reports that she does not use  drugs.  Allergies:  Allergies  Allergen Reactions  . Sulfa Antibiotics Hives  . Tetracyclines & Related Hives    Medications:  I reviewed home medications   ROS:                                                                                                                                     Unable to review due to altered mental status   Examination:                                                                                                      General: Appears well-developed  Psych: Anxious Eyes: No scleral injection HENT: No OP obstrucion Head: Normocephalic.  Cardiovascular: Normal rate and regular rhythm.  Respiratory: Effort normal and breath sounds normal to anterior ascultation GI: Soft.  No distension. There is no tenderness.  Skin: WDI    Neurological Examination Mental Status: Alert, can state her name, does not follow commands consistently.  Has both expressive and receptive aphasia. Cranial Nerves: II: Visual fields: Left blink to threat on the right side.  Right side visual hemineglect III,IV, VI: ptosis not present, extra-ocular motions intact bilaterally and cannot cross midline, gaze preference to the left, pupils equal, round, reactive to light and accommodation V,VII: smile symmetric VIII: hearing normal bilaterally IX,X: uvula rises symmetrically XI: bilateral shoulder shrug XII: midline tongue extension Motor: Right : Upper extremity   4+/5    Left:     Upper extremity   5/5  Lower extremity   3+/5     Lower extremity   5/5 Tone and bulk:normal tone throughout; no atrophy noted Sensory: Withdraws to noxious stimulus on both sides, notices light touch on the left compared to the right Plantars: Right: downgoing   Left: downgoing Cerebellar: No obvious ataxia noted      Lab Results: Basic Metabolic Panel: Recent Labs   Lab 10/05/19 0419  NA 142  K 3.3*  CL 105  CO2 28  GLUCOSE 100*  BUN 11  CREATININE 0.67  CALCIUM 8.7*  MG 2.0    CBC: Recent Labs  Lab 10/05/19 0419  WBC 7.8  NEUTROABS 5.3  HGB 13.4  HCT 40.3  MCV 85.0  PLT 292    Coagulation Studies: Recent Labs    10/05/19 0419  LABPROT 12.1  INR 0.9    Imaging: CT ANGIO HEAD W OR WO CONTRAST  Result Date: 10/05/2019 CLINICAL DATA:  Acute left MCA infarct. Confusion, expressive aphasia, and right-sided neglect. EXAM: CT ANGIOGRAPHY HEAD AND NECK CT PERFUSION BRAIN TECHNIQUE: Multidetector CT imaging of the head and neck was performed using the standard protocol  during bolus administration of intravenous contrast. Multiplanar CT image reconstructions and MIPs were obtained to evaluate the vascular anatomy. Carotid stenosis measurements (when applicable) are obtained utilizing NASCET criteria, using the distal internal carotid diameter as the denominator. Multiphase CT imaging of the brain was performed following IV bolus contrast injection. Subsequent parametric perfusion maps were calculated using RAPID software. CONTRAST:  100mL OMNIPAQUE IOHEXOL 350 MG/ML SOLN COMPARISON:  Head CT and MRI 10/05/2019 FINDINGS: CT HEAD FINDINGS Brain: A moderate-sized acute left MCA territory infarct is again seen involving the left temporal, parietal, and posterior frontal lobes. Cytotoxic edema has mildly increased compared to today's earlier CT, and the distribution of the infarct is unchanged from the MRI. No acute intracranial hemorrhage, midline shift, or extra-axial fluid collection is identified. The ventricles are normal in size. Vascular: Calcified atherosclerosis at the skull base. Skull: No fracture or suspicious osseous lesion. Sinuses/Orbits: Paranasal sinuses and mastoid air cells are clear. Unremarkable orbits. Other: None. ASPECTS [facility] Stroke Program Early CT Score) - Ganglionic level infarction (caudate, lentiform nuclei, internal  capsule, insula, M1-M3 cortex): 5 - Supraganglionic infarction (M4-M6 cortex): 2 Total score (0-10 with 10 being normal): 7 Review of the MIP images confirms the above findings CTA NECK FINDINGS Aortic arch: Standard 3 vessel aortic arch with widely patent arch vessel origins. Right carotid system: Patent with minimal atherosclerotic plaque at the carotid bifurcation. No evidence of stenosis or dissection. Left carotid system: Patent without evidence of stenosis or dissection. Vertebral arteries: Patent and codominant without evidence of stenosis or dissection. Skeleton: Moderately large anterior vertebral osteophytes at C5-6 and C6-7. Other neck: No evidence of cervical lymphadenopathy or mass. Upper chest: Clear lung apices. Review of the MIP images confirms the above findings CTA HEAD FINDINGS Anterior circulation: The internal carotid arteries are patent from skull base to carotid termini with mild atherosclerotic plaque bilaterally not resulting in significant stenosis. The M1 segments are widely patent bilaterally, however there is occlusion of the left M2 inferior division near its origin. ACAs are patent with an azygos A2 configuration and no significant proximal stenosis. No aneurysm is identified. Posterior circulation: The intracranial vertebral arteries are widely patent to the basilar. Patent left PICA, right AICA, and bilateral SCA is are seen. The basilar artery is widely patent. Posterior communicating arteries are diminutive or absent. Both PCAs are patent without evidence of significant stenosis on the left. There are severe proximal P2 and P3 stenoses on the right. No aneurysm is identified. Venous sinuses: As permitted by contrast timing, patent. Anatomic variants: Azygos A2. Review of the MIP images confirms the above findings CT Brain Perfusion Findings: ASPECTS: 7 CBF (<30%) Volume: 0mL, however a core infarct is evident on CT and MRI Perfusion (Tmax>6.0s) volume: 29mL Mismatch Volume: The  region of prolonged Tmax corresponds to the hypodensity on CT and diffusion abnormality on MRI, and no significant penumbra is present Infarction Location: Left MCA inferior division IMPRESSION: 1. Left MCA M2 inferior division occlusion. 2. Associated moderate-sized acute left MCA infarct without significant penumbra. 3. 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 4. Widely patent cervical carotid and vertebral arteries. These results were communicated to Dr. Laurence SlateAroor at 9:30 am on 10/05/2019 by text page via the [facility] messaging system. Electronically Signed   By: Sebastian AcheAllen  Grady M.D.   On: 10/05/2019 09:46   CT HEAD WO CONTRAST  Result Date: 10/05/2019 CLINICAL DATA:  Confusion.  Right-sided weakness. EXAM: CT HEAD WITHOUT CONTRAST TECHNIQUE: Contiguous axial images were obtained from the base of  the skull through the vertex without intravenous contrast. COMPARISON:  None. FINDINGS: Brain: Sizable area of cytotoxic edema in the superficial left temporal lobe and even more defined in the left frontal parietal region, inferior division MCA territory. No acute hemorrhage. No hydrocephalus or shift. Elsewhere the brain has a normal appearance Vascular: No hyperdense vessel. Skull: Normal Sinuses/Orbits: Normal These results were called by telephone at the time of interpretation on 10/05/2019 at 4:38 am to provider [facility] , who verbally acknowledged these results. IMPRESSION: Acute infarct affecting the superficial left temporal lobe and frontal parietal junction, inferior division MCA territory. No acute hemorrhage. Electronically Signed   By: Marnee Spring M.D.   On: 10/05/2019 04:38   CT ANGIO NECK W OR WO CONTRAST  Result Date: 10/05/2019 CLINICAL DATA:  Acute left MCA infarct. Confusion, expressive aphasia, and right-sided neglect. EXAM: CT ANGIOGRAPHY HEAD AND NECK CT PERFUSION BRAIN TECHNIQUE: Multidetector CT imaging of the head and neck was performed using the standard protocol during bolus  administration of intravenous contrast. Multiplanar CT image reconstructions and MIPs were obtained to evaluate the vascular anatomy. Carotid stenosis measurements (when applicable) are obtained utilizing NASCET criteria, using the distal internal carotid diameter as the denominator. Multiphase CT imaging of the brain was performed following IV bolus contrast injection. Subsequent parametric perfusion maps were calculated using RAPID software. CONTRAST:  OMNIPAQUE IOHEXOL 350 MG/ML SOLN COMPARISON:  Head CT and MRI 10/05/2019 FINDINGS: CT HEAD FINDINGS Brain: A moderate-sized acute left MCA territory infarct is again seen involving the left temporal, parietal, and posterior frontal lobes. Cytotoxic edema has mildly increased compared to today's earlier CT, and the distribution of the infarct is unchanged from the MRI. No acute intracranial hemorrhage, midline shift, or extra-axial fluid collection is identified. The ventricles are normal in size. Vascular: Calcified atherosclerosis at the skull base. Skull: No fracture or suspicious osseous lesion. Sinuses/Orbits: Paranasal sinuses and mastoid air cells are clear. Unremarkable orbits. Other: None. ASPECTS [facility] Stroke Program Early CT Score) - Ganglionic level infarction (caudate, lentiform nuclei, internal capsule, insula, M1-M3 cortex): 5 - Supraganglionic infarction (M4-M6 cortex): 2 Total score (0-10 with 10 being normal): 7 Review of the MIP images confirms the above findings CTA NECK FINDINGS Aortic arch: Standard 3 vessel aortic arch with widely patent arch vessel origins. Right carotid system: Patent with minimal atherosclerotic plaque at the carotid bifurcation. No evidence of stenosis or dissection. Left carotid system: Patent without evidence of stenosis or dissection. Vertebral arteries: Patent and codominant without evidence of stenosis or dissection. Skeleton: Moderately large anterior vertebral osteophytes at C5-6 and C6-7. Other neck: No  evidence of cervical lymphadenopathy or mass. Upper chest: Clear lung apices. Review of the MIP images confirms the above findings CTA HEAD FINDINGS Anterior circulation: The internal carotid arteries are patent from skull base to carotid termini with mild atherosclerotic plaque bilaterally not resulting in significant stenosis. The M1 segments are widely patent bilaterally, however there is occlusion of the left M2 inferior division near its origin. ACAs are patent with an azygos A2 configuration and no significant proximal stenosis. No aneurysm is identified. Posterior circulation: The intracranial vertebral arteries are widely patent to the basilar. Patent left PICA, right AICA, and bilateral SCA is are seen. The basilar artery is widely patent. Posterior communicating arteries are diminutive or absent. Both PCAs are patent without evidence of significant stenosis on the left. There are severe proximal P2 and P3 stenoses on the right. No aneurysm is identified. Venous sinuses: As permitted by  contrast timing, patent. Anatomic variants: Azygos A2. Review of the MIP images confirms the above findings CT Brain Perfusion Findings: ASPECTS: 7 CBF (<30%) Volume: 102mL, however a core infarct is evident on CT and MRI Perfusion (Tmax>6.0s) volume: 29mL Mismatch Volume: The region of prolonged Tmax corresponds to the hypodensity on CT and diffusion abnormality on MRI, and no significant penumbra is present Infarction Location: Left MCA inferior division IMPRESSION: 1. Left MCA M2 inferior division occlusion. 2. Associated moderate-sized acute left MCA infarct without significant penumbra. 3. 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 4. Widely patent cervical carotid and vertebral arteries. These results were communicated to Dr. Laurence Slate at 9:30 am on 10/05/2019 by text page via the [facility] messaging system. Electronically Signed   By: Sebastian Ache M.D.   On: 10/05/2019 09:46   MR BRAIN WO CONTRAST  Result Date:  10/05/2019 CLINICAL DATA:  Ataxia with stroke suspected EXAM: MRI HEAD WITHOUT CONTRAST TECHNIQUE: Multiplanar, multiecho pulse sequences of the brain and surrounding structures were obtained without intravenous contrast. COMPARISON:  Head CT from earlier today FINDINGS: Brain: Large area of restricted diffusion in the superficial left temporal cortex and more patchy in the posterior left frontal and parietal cortex. The more inferior infarct has a somewhat more hazy appearance. Based on CT the infarcts may be of slightly different ages, although both acute. No acute hemorrhage, hydrocephalus, or masslike finding. No pre-existing infarct affecting the cortex. Small vessel type changes are minimal and less than expected for age. Brain volume is normal Vascular: Normal proximal flow voids. Skull and upper cervical spine: Normal marrow signal Sinuses/Orbits: Negative IMPRESSION: 1. Large acute infarct in the inferior division left MCA territory affecting temporal, posterior frontal, and parietal cortex. 2. No hemorrhagic conversion.  No pre-existing infarct. Electronically Signed   By: Marnee Spring M.D.   On: 10/05/2019 06:49   CT CEREBRAL PERFUSION W CONTRAST  Result Date: 10/05/2019 CLINICAL DATA:  Acute left MCA infarct. Confusion, expressive aphasia, and right-sided neglect. EXAM: CT ANGIOGRAPHY HEAD AND NECK CT PERFUSION BRAIN TECHNIQUE: Multidetector CT imaging of the head and neck was performed using the standard protocol during bolus administration of intravenous contrast. Multiplanar CT image reconstructions and MIPs were obtained to evaluate the vascular anatomy. Carotid stenosis measurements (when applicable) are obtained utilizing NASCET criteria, using the distal internal carotid diameter as the denominator. Multiphase CT imaging of the brain was performed following IV bolus contrast injection. Subsequent parametric perfusion maps were calculated using RAPID software. CONTRAST:  OMNIPAQUE  IOHEXOL 350 MG/ML SOLN COMPARISON:  Head CT and MRI 10/05/2019 FINDINGS: CT HEAD FINDINGS Brain: A moderate-sized acute left MCA territory infarct is again seen involving the left temporal, parietal, and posterior frontal lobes. Cytotoxic edema has mildly increased compared to today's earlier CT, and the distribution of the infarct is unchanged from the MRI. No acute intracranial hemorrhage, midline shift, or extra-axial fluid collection is identified. The ventricles are normal in size. Vascular: Calcified atherosclerosis at the skull base. Skull: No fracture or suspicious osseous lesion. Sinuses/Orbits: Paranasal sinuses and mastoid air cells are clear. Unremarkable orbits. Other: None. ASPECTS [facility] Stroke Program Early CT Score) - Ganglionic level infarction (caudate, lentiform nuclei, internal capsule, insula, M1-M3 cortex): 5 - Supraganglionic infarction (M4-M6 cortex): 2 Total score (0-10 with 10 being normal): 7 Review of the MIP images confirms the above findings CTA NECK FINDINGS Aortic arch: Standard 3 vessel aortic arch with widely patent arch vessel origins. Right carotid system: Patent with minimal atherosclerotic plaque at the  carotid bifurcation. No evidence of stenosis or dissection. Left carotid system: Patent without evidence of stenosis or dissection. Vertebral arteries: Patent and codominant without evidence of stenosis or dissection. Skeleton: Moderately large anterior vertebral osteophytes at C5-6 and C6-7. Other neck: No evidence of cervical lymphadenopathy or mass. Upper chest: Clear lung apices. Review of the MIP images confirms the above findings CTA HEAD FINDINGS Anterior circulation: The internal carotid arteries are patent from skull base to carotid termini with mild atherosclerotic plaque bilaterally not resulting in significant stenosis. The M1 segments are widely patent bilaterally, however there is occlusion of the left M2 inferior division near its origin. ACAs are patent with an  azygos A2 configuration and no significant proximal stenosis. No aneurysm is identified. Posterior circulation: The intracranial vertebral arteries are widely patent to the basilar. Patent left PICA, right AICA, and bilateral SCA is are seen. The basilar artery is widely patent. Posterior communicating arteries are diminutive or absent. Both PCAs are patent without evidence of significant stenosis on the left. There are severe proximal P2 and P3 stenoses on the right. No aneurysm is identified. Venous sinuses: As permitted by contrast timing, patent. Anatomic variants: Azygos A2. Review of the MIP images confirms the above findings CT Brain Perfusion Findings: ASPECTS: 7 CBF (<30%) Volume: 69mL, however a core infarct is evident on CT and MRI Perfusion (Tmax>6.0s) volume: 9mL Mismatch Volume: The region of prolonged Tmax corresponds to the hypodensity on CT and diffusion abnormality on MRI, and no significant penumbra is present Infarction Location: Left MCA inferior division IMPRESSION: 1. Left MCA M2 inferior division occlusion. 2. Associated moderate-sized acute left MCA infarct without significant penumbra. 3. 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 4. Widely patent cervical carotid and vertebral arteries. These results were communicated to Dr. Lorraine Lax at 9:30 am on 10/05/2019 by text page via the [facility] messaging system. Electronically Signed   By: Logan Bores M.D.   On: 10/05/2019 09:46     ASSESSMENT AND PLAN  64 y.o. female with PMH of devrticulitis, IBS presents to the ED with altered mental status when family woke her up at [place] to take her to the airport-noted to be aphasic and have right-sided weakness.  CT head already showed acute left MCA changes and MRI brain confirmed large infarct in the MCA territory.  CT angiogram shows a left M2 inferior division occlusion and severe proximal P2 and P3 stenosis. CTP : The region of prolonged Tmax corresponds to the hypodensity on CT and diffusion abnormality  on MRI, and no significant penumbra is present.   Not a candidate for TPA as she is outside the window and not can for LVO due to already established stroke with no penumbra.  I suspect etiology of stroke is embolic, likely cardioembolic.    Left MCA territory Acute Ischemic Stroke  Left MCA M2 inferior division occlusion Aphasia Right-sided hemiparesis  Risk factors: no known vascular risk factors Etiology: Embolic -athero vs cardioembolic  Recommendations #Transthoracic Echo with bubble study, if negative will need TEE and loop monitor # Start patient on ASA 325mg  daily #Start or continue Atorvastatin 40 mg/other high intensity statin # BP goal: permissive HTN upto 220/120 mmHg # HBAIC and Lipid profile # Telemetry monitoring # Frequent neuro checks # Stroke swallow screen #Speech therapy evaluation, PT and OT  Please page stroke NP  Or  PA  Or MD from 8am -4 pm  as this patient from this time will be  followed by the stroke.   You  can look them up on [email or web address]  Password [facility]   Robin Mosley [facility] Pager Number [phone]

## 2019-10-05 NOTE — Progress Notes (Signed)
PROGRESS NOTE    Robin Mosley  IOE:[id number] DOB: 05/07/1956 DOA: 10/05/2019 PCP: Patient, No Pcp Per      Brief Narrative:  Robin Mosley is a 64 y.o. F with hx IBS, smoking who presented with acute aphasia.  Patient LSN at 9PM evening prior to presentation.  At 3AM, family found her wandering in house, aphasic.  In the ER, CT head showed acute left MCA infarct.  CODE STROKE not called, Neurology not consulted.  EtOH negative, cannabis UDS positive.  Patient given aspirin and admitted to hospitalist service.      Assessment & Plan:  Acute stroke CT head showed acute stroke.  MRI brain showed -Non-invasive angiography showed "Large acute infarct in the inferior division left MCA territory affecting temporal, posterior frontal, and parietal cortex". -Echocardiogram ordered -Carotid imaging pending  -Lipids ordered: started Crestor -Aspirin ordered at admission --> continue aspirin 325 -Atrial fibrillation: not present on tele -tPA not given because outside window -Dysphagia screen ordered in ER, passed -PT eval ordered -Smoking cessation: recommended    Hypokalemia Mild -Supp K -Check mag      Disposition: The patient was admitted with new acute stroke.  She has severe ongoing deficits in expressive aphasia, and right-sided hemiparesis.  She will need extensive rehabilitation return to her prior independent level of function.   I will discharge when her TEE has been completed, for therapy discharge proposition is complete.        MDM: The below labs and imaging reports were reviewed and summarized above.  Medication management as above.     DVT prophylaxis: Lovenox Code Status: Full code Family Communication: Brother    Consultants:   Neuro  Procedures:   TTE  MRI  CTA head and neck  Antimicrobials:      Culture data:              Subjective: Patient is feeling well, her appetite is good.  No confusion.  She has  right-sided weakness, and persistent expressive aphasia.  Objective: Vitals:   10/05/19 0841 10/05/19 1140 10/05/19 1340 10/05/19 1540  BP: (!) 143/75 (!) 159/77 (!) 150/82 (!) 150/68  Pulse: 85 78 76 71  Resp: 16 16 16 16   Temp: 98.6 F (37 C) 98.4 F (36.9 C) 98.5 F (36.9 C) 98.5 F (36.9 C)  TempSrc: Oral Oral Oral Oral  SpO2: 95% 98% 96% 98%  Weight:      Height:       No intake or output data in the 24 hours ending 10/05/19 1622 Filed Weights   10/05/19 0500  Weight: 62.9 kg    Examination: General appearance: Nourished adult female, alert and in no acute distress.   HEENT: Anicteric, conjunctiva pink, lids and lashes normal. No nasal deformity, discharge, epistaxis.  Lips moist.   Skin: Warm and dry.  No jaundice.  No suspicious rashes or lesions. Cardiac: RRR, nl S1-S2, no murmurs appreciated.  Capillary refill is brisk.  JVP normal  No LE edema.  Radial  pulses 2+ and symmetric. Respiratory: Normal respiratory rate and rhythm.  CTAB without rales or wheezes. Abdomen: Abdomen soft.  No TTP guarding. No ascites, distension, hepatosplenomegaly.   MSK: No deformities or effusions. Neuro: Awake and alert.  EOMI, moves right side with 4/5 strength, left-sided strength is normal. Speech aphasic.  Sometimes has difficulty following commands. Psych: Sensorium intact and responding to questions, attention diminished. Affect blunted.  Judgment and insight appear impaired.    Data Reviewed: I have personally reviewed  following labs and imaging studies:  CBC: Recent Labs  Lab 10/05/19 0419  WBC 7.8  NEUTROABS 5.3  HGB 13.4  HCT 40.3  MCV 85.0  PLT 292   Basic Metabolic Panel: Recent Labs  Lab 10/05/19 0419  NA 142  K 3.3*  CL 105  CO2 28  GLUCOSE 100*  BUN 11  CREATININE 0.67  CALCIUM 8.7*  MG 2.0   GFR: Estimated Creatinine Clearance: 62.7 mL/min (by C-G formula based on SCr of 0.67 mg/dL). Liver Function Tests: Recent Labs  Lab 10/05/19 0419  AST 16   ALT 13  ALKPHOS 81  BILITOT 0.4  PROT 7.5  ALBUMIN 3.9   No results for input(s): LIPASE, AMYLASE in the last 168 hours. No results for input(s): AMMONIA in the last 168 hours. Coagulation Profile: Recent Labs  Lab 10/05/19 0419  INR 0.9   Cardiac Enzymes: No results for input(s): CKTOTAL, CKMB, CKMBINDEX, TROPONINI in the last 168 hours. BNP (last 3 results) No results for input(s): PROBNP in the last 8760 hours. HbA1C: Recent Labs    10/05/19 0419  HGBA1C 5.5   CBG: No results for input(s): GLUCAP in the last 168 hours. Lipid Profile: Recent Labs    10/05/19 0419  CHOL 224*  HDL 52  LDLCALC 150*  TRIG 112  CHOLHDL 4.3   Thyroid Function Tests: No results for input(s): TSH, T4TOTAL, FREET4, T3FREE, THYROIDAB in the last 72 hours. Anemia Panel: No results for input(s): VITAMINB12, FOLATE, FERRITIN, TIBC, IRON, RETICCTPCT in the last 72 hours. Urine analysis:    Component Value Date/Time   COLORURINE YELLOW (A) 10/05/2019 0419   APPEARANCEUR HAZY (A) 10/05/2019 0419   LABSPEC 1.019 10/05/2019 0419   PHURINE 5.0 10/05/2019 0419   GLUCOSEU NEGATIVE 10/05/2019 0419   HGBUR MODERATE (A) 10/05/2019 0419   BILIRUBINUR NEGATIVE 10/05/2019 0419   KETONESUR NEGATIVE 10/05/2019 0419   PROTEINUR NEGATIVE 10/05/2019 0419   NITRITE NEGATIVE 10/05/2019 0419   LEUKOCYTESUR NEGATIVE 10/05/2019 0419   Sepsis Labs: @LABRCNTIP (procalcitonin:4,lacticacidven:4)  ) Recent Results (from the past 240 hour(s))  Respiratory Panel by RT PCR (Flu A&B, Covid) - Nasopharyngeal Swab     Status: None   Collection Time: 10/05/19  4:19 AM   Specimen: Nasopharyngeal Swab  Result Value Ref Range Status   SARS Coronavirus 2 by RT PCR NEGATIVE NEGATIVE Final    Comment: (NOTE) SARS-CoV-2 target nucleic acids are NOT DETECTED. The SARS-CoV-2 RNA is generally detectable in upper respiratoy specimens during the acute phase of infection. The lowest concentration of SARS-CoV-2 viral copies  this assay can detect is 131 copies/mL. A negative result does not preclude SARS-Cov-2 infection and should not be used as the sole basis for treatment or other patient management decisions. A negative result may occur with  improper specimen collection/handling, submission of specimen other than nasopharyngeal swab, presence of viral mutation(s) within the areas targeted by this assay, and inadequate number of viral copies (<131 copies/mL). A negative result must be combined with clinical observations, patient history, and epidemiological information. The expected result is Negative. Fact Sheet for Patients:  https://www.moore.com/ Fact Sheet for Healthcare Providers:  https://www.young.biz/ This test is not yet ap proved or cleared by the [place] FDA and  has been authorized for detection and/or diagnosis of SARS-CoV-2 by FDA under an Emergency Use Authorization (EUA). This EUA will remain  in effect (meaning this test can be used) for the duration of the COVID-19 declaration under Section 564(b)(1) of the Act, 21 U.S.C. section 360bbb-3(b)(1),  unless the authorization is terminated or revoked sooner.    Influenza A by PCR NEGATIVE NEGATIVE Final   Influenza B by PCR NEGATIVE NEGATIVE Final    Comment: (NOTE) The Xpert Xpress SARS-CoV-2/FLU/RSV assay is intended as an aid in  the diagnosis of influenza from Nasopharyngeal swab specimens and  should not be used as a sole basis for treatment. Nasal washings and  aspirates are unacceptable for Xpert Xpress SARS-CoV-2/FLU/RSV  testing. Fact Sheet for Patients: https://www.moore.com/https://www.fda.gov/media/142436/download Fact Sheet for Healthcare Providers: https://www.young.biz/https://www.fda.gov/media/142435/download This test is not yet approved or cleared by the [place] FDA and  has been authorized for detection and/or diagnosis of SARS-CoV-2 by  FDA under an Emergency Use Authorization (EUA). This EUA will remain  in  effect (meaning this test can be used) for the duration of the  Covid-19 declaration under Section 564(b)(1) of the Act, 21  U.S.C. section 360bbb-3(b)(1), unless the authorization is  terminated or revoked. Performed at [facility], [address] [place], [place] [id number]          Radiology Studies: CT ANGIO HEAD W OR WO CONTRAST  Result Date: 10/05/2019 CLINICAL DATA:  Acute left MCA infarct. Confusion, expressive aphasia, and right-sided neglect. EXAM: CT ANGIOGRAPHY HEAD AND NECK CT PERFUSION BRAIN TECHNIQUE: Multidetector CT imaging of the head and neck was performed using the standard protocol during bolus administration of intravenous contrast. Multiplanar CT image reconstructions and MIPs were obtained to evaluate the vascular anatomy. Carotid stenosis measurements (when applicable) are obtained utilizing NASCET criteria, using the distal internal carotid diameter as the denominator. Multiphase CT imaging of the brain was performed following IV bolus contrast injection. Subsequent parametric perfusion maps were calculated using RAPID software. CONTRAST:  100mL OMNIPAQUE IOHEXOL 350 MG/ML SOLN COMPARISON:  Head CT and MRI 10/05/2019 FINDINGS: CT HEAD FINDINGS Brain: A moderate-sized acute left MCA territory infarct is again seen involving the left temporal, parietal, and posterior frontal lobes. Cytotoxic edema has mildly increased compared to today's earlier CT, and the distribution of the infarct is unchanged from the MRI. No acute intracranial hemorrhage, midline shift, or extra-axial fluid collection is identified. The ventricles are normal in size. Vascular: Calcified atherosclerosis at the skull base. Skull: No fracture or suspicious osseous lesion. Sinuses/Orbits: Paranasal sinuses and mastoid air cells are clear. Unremarkable orbits. Other: None. ASPECTS [facility]([place] Stroke Program Early CT Score) - Ganglionic level infarction (caudate, lentiform nuclei, internal capsule,  insula, M1-M3 cortex): 5 - Supraganglionic infarction (M4-M6 cortex): 2 Total score (0-10 with 10 being normal): 7 Review of the MIP images confirms the above findings CTA NECK FINDINGS Aortic arch: Standard 3 vessel aortic arch with widely patent arch vessel origins. Right carotid system: Patent with minimal atherosclerotic plaque at the carotid bifurcation. No evidence of stenosis or dissection. Left carotid system: Patent without evidence of stenosis or dissection. Vertebral arteries: Patent and codominant without evidence of stenosis or dissection. Skeleton: Moderately large anterior vertebral osteophytes at C5-6 and C6-7. Other neck: No evidence of cervical lymphadenopathy or mass. Upper chest: Clear lung apices. Review of the MIP images confirms the above findings CTA HEAD FINDINGS Anterior circulation: The internal carotid arteries are patent from skull base to carotid termini with mild atherosclerotic plaque bilaterally not resulting in significant stenosis. The M1 segments are widely patent bilaterally, however there is occlusion of the left M2 inferior division near its origin. ACAs are patent with an azygos A2 configuration and no significant proximal stenosis. No aneurysm is identified. Posterior circulation: The intracranial vertebral arteries  are widely patent to the basilar. Patent left PICA, right AICA, and bilateral SCA is are seen. The basilar artery is widely patent. Posterior communicating arteries are diminutive or absent. Both PCAs are patent without evidence of significant stenosis on the left. There are severe proximal P2 and P3 stenoses on the right. No aneurysm is identified. Venous sinuses: As permitted by contrast timing, patent. Anatomic variants: Azygos A2. Review of the MIP images confirms the above findings CT Brain Perfusion Findings: ASPECTS: 7 CBF (<30%) Volume: 0mL, however a core infarct is evident on CT and MRI Perfusion (Tmax>6.0s) volume: 29mL Mismatch Volume: The region of  prolonged Tmax corresponds to the hypodensity on CT and diffusion abnormality on MRI, and no significant penumbra is present Infarction Location: Left MCA inferior division IMPRESSION: 1. Left MCA M2 inferior division occlusion. 2. Associated moderate-sized acute left MCA infarct without significant penumbra. 3. 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 4. Widely patent cervical carotid and vertebral arteries. These results were communicated to Dr. Laurence SlateAroor at 9:30 am on 10/05/2019 by text page via the [facility] messaging system. Electronically Signed   By: Sebastian AcheAllen  Grady M.D.   On: 10/05/2019 09:46   CT HEAD WO CONTRAST  Result Date: 10/05/2019 CLINICAL DATA:  Confusion.  Right-sided weakness. EXAM: CT HEAD WITHOUT CONTRAST TECHNIQUE: Contiguous axial images were obtained from the base of the skull through the vertex without intravenous contrast. COMPARISON:  None. FINDINGS: Brain: Sizable area of cytotoxic edema in the superficial left temporal lobe and even more defined in the left frontal parietal region, inferior division MCA territory. No acute hemorrhage. No hydrocephalus or shift. Elsewhere the brain has a normal appearance Vascular: No hyperdense vessel. Skull: Normal Sinuses/Orbits: Normal These results were called by telephone at the time of interpretation on 10/05/2019 at 4:38 am to provider [facility] , who verbally acknowledged these results. IMPRESSION: Acute infarct affecting the superficial left temporal lobe and frontal parietal junction, inferior division MCA territory. No acute hemorrhage. Electronically Signed   By: Marnee SpringJonathon  Watts M.D.   On: 10/05/2019 04:38   CT ANGIO NECK W OR WO CONTRAST  Result Date: 10/05/2019 CLINICAL DATA:  Acute left MCA infarct. Confusion, expressive aphasia, and right-sided neglect. EXAM: CT ANGIOGRAPHY HEAD AND NECK CT PERFUSION BRAIN TECHNIQUE: Multidetector CT imaging of the head and neck was performed using the standard protocol during bolus administration of  intravenous contrast. Multiplanar CT image reconstructions and MIPs were obtained to evaluate the vascular anatomy. Carotid stenosis measurements (when applicable) are obtained utilizing NASCET criteria, using the distal internal carotid diameter as the denominator. Multiphase CT imaging of the brain was performed following IV bolus contrast injection. Subsequent parametric perfusion maps were calculated using RAPID software. CONTRAST:  100mL OMNIPAQUE IOHEXOL 350 MG/ML SOLN COMPARISON:  Head CT and MRI 10/05/2019 FINDINGS: CT HEAD FINDINGS Brain: A moderate-sized acute left MCA territory infarct is again seen involving the left temporal, parietal, and posterior frontal lobes. Cytotoxic edema has mildly increased compared to today's earlier CT, and the distribution of the infarct is unchanged from the MRI. No acute intracranial hemorrhage, midline shift, or extra-axial fluid collection is identified. The ventricles are normal in size. Vascular: Calcified atherosclerosis at the skull base. Skull: No fracture or suspicious osseous lesion. Sinuses/Orbits: Paranasal sinuses and mastoid air cells are clear. Unremarkable orbits. Other: None. ASPECTS [facility]([place] Stroke Program Early CT Score) - Ganglionic level infarction (caudate, lentiform nuclei, internal capsule, insula, M1-M3 cortex): 5 - Supraganglionic infarction (M4-M6 cortex): 2 Total score (0-10 with 10  being normal): 7 Review of the MIP images confirms the above findings CTA NECK FINDINGS Aortic arch: Standard 3 vessel aortic arch with widely patent arch vessel origins. Right carotid system: Patent with minimal atherosclerotic plaque at the carotid bifurcation. No evidence of stenosis or dissection. Left carotid system: Patent without evidence of stenosis or dissection. Vertebral arteries: Patent and codominant without evidence of stenosis or dissection. Skeleton: Moderately large anterior vertebral osteophytes at C5-6 and C6-7. Other neck: No evidence of cervical  lymphadenopathy or mass. Upper chest: Clear lung apices. Review of the MIP images confirms the above findings CTA HEAD FINDINGS Anterior circulation: The internal carotid arteries are patent from skull base to carotid termini with mild atherosclerotic plaque bilaterally not resulting in significant stenosis. The M1 segments are widely patent bilaterally, however there is occlusion of the left M2 inferior division near its origin. ACAs are patent with an azygos A2 configuration and no significant proximal stenosis. No aneurysm is identified. Posterior circulation: The intracranial vertebral arteries are widely patent to the basilar. Patent left PICA, right AICA, and bilateral SCA is are seen. The basilar artery is widely patent. Posterior communicating arteries are diminutive or absent. Both PCAs are patent without evidence of significant stenosis on the left. There are severe proximal P2 and P3 stenoses on the right. No aneurysm is identified. Venous sinuses: As permitted by contrast timing, patent. Anatomic variants: Azygos A2. Review of the MIP images confirms the above findings CT Brain Perfusion Findings: ASPECTS: 7 CBF (<30%) Volume: 0mL, however a core infarct is evident on CT and MRI Perfusion (Tmax>6.0s) volume: 29mL Mismatch Volume: The region of prolonged Tmax corresponds to the hypodensity on CT and diffusion abnormality on MRI, and no significant penumbra is present Infarction Location: Left MCA inferior division IMPRESSION: 1. Left MCA M2 inferior division occlusion. 2. Associated moderate-sized acute left MCA infarct without significant penumbra. 3. 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 4. Widely patent cervical carotid and vertebral arteries. These results were communicated to Dr. Laurence Slate at 9:30 am on 10/05/2019 by text page via the [facility] messaging system. Electronically Signed   By: Sebastian Ache M.D.   On: 10/05/2019 09:46   MR BRAIN WO CONTRAST  Result Date: 10/05/2019 CLINICAL DATA:  Ataxia  with stroke suspected EXAM: MRI HEAD WITHOUT CONTRAST TECHNIQUE: Multiplanar, multiecho pulse sequences of the brain and surrounding structures were obtained without intravenous contrast. COMPARISON:  Head CT from earlier today FINDINGS: Brain: Large area of restricted diffusion in the superficial left temporal cortex and more patchy in the posterior left frontal and parietal cortex. The more inferior infarct has a somewhat more hazy appearance. Based on CT the infarcts may be of slightly different ages, although both acute. No acute hemorrhage, hydrocephalus, or masslike finding. No pre-existing infarct affecting the cortex. Small vessel type changes are minimal and less than expected for age. Brain volume is normal Vascular: Normal proximal flow voids. Skull and upper cervical spine: Normal marrow signal Sinuses/Orbits: Negative IMPRESSION: 1. Large acute infarct in the inferior division left MCA territory affecting temporal, posterior frontal, and parietal cortex. 2. No hemorrhagic conversion.  No pre-existing infarct. Electronically Signed   By: Marnee Spring M.D.   On: 10/05/2019 06:49   CT CEREBRAL PERFUSION W CONTRAST  Result Date: 10/05/2019 CLINICAL DATA:  Acute left MCA infarct. Confusion, expressive aphasia, and right-sided neglect. EXAM: CT ANGIOGRAPHY HEAD AND NECK CT PERFUSION BRAIN TECHNIQUE: Multidetector CT imaging of the head and neck was performed using the standard protocol during bolus  administration of intravenous contrast. Multiplanar CT image reconstructions and MIPs were obtained to evaluate the vascular anatomy. Carotid stenosis measurements (when applicable) are obtained utilizing NASCET criteria, using the distal internal carotid diameter as the denominator. Multiphase CT imaging of the brain was performed following IV bolus contrast injection. Subsequent parametric perfusion maps were calculated using RAPID software. CONTRAST:  OMNIPAQUE IOHEXOL 350 MG/ML SOLN COMPARISON:   Head CT and MRI 10/05/2019 FINDINGS: CT HEAD FINDINGS Brain: A moderate-sized acute left MCA territory infarct is again seen involving the left temporal, parietal, and posterior frontal lobes. Cytotoxic edema has mildly increased compared to today's earlier CT, and the distribution of the infarct is unchanged from the MRI. No acute intracranial hemorrhage, midline shift, or extra-axial fluid collection is identified. The ventricles are normal in size. Vascular: Calcified atherosclerosis at the skull base. Skull: No fracture or suspicious osseous lesion. Sinuses/Orbits: Paranasal sinuses and mastoid air cells are clear. Unremarkable orbits. Other: None. ASPECTS [facility] Stroke Program Early CT Score) - Ganglionic level infarction (caudate, lentiform nuclei, internal capsule, insula, M1-M3 cortex): 5 - Supraganglionic infarction (M4-M6 cortex): 2 Total score (0-10 with 10 being normal): 7 Review of the MIP images confirms the above findings CTA NECK FINDINGS Aortic arch: Standard 3 vessel aortic arch with widely patent arch vessel origins. Right carotid system: Patent with minimal atherosclerotic plaque at the carotid bifurcation. No evidence of stenosis or dissection. Left carotid system: Patent without evidence of stenosis or dissection. Vertebral arteries: Patent and codominant without evidence of stenosis or dissection. Skeleton: Moderately large anterior vertebral osteophytes at C5-6 and C6-7. Other neck: No evidence of cervical lymphadenopathy or mass. Upper chest: Clear lung apices. Review of the MIP images confirms the above findings CTA HEAD FINDINGS Anterior circulation: The internal carotid arteries are patent from skull base to carotid termini with mild atherosclerotic plaque bilaterally not resulting in significant stenosis. The M1 segments are widely patent bilaterally, however there is occlusion of the left M2 inferior division near its origin. ACAs are patent with an azygos A2 configuration and no  significant proximal stenosis. No aneurysm is identified. Posterior circulation: The intracranial vertebral arteries are widely patent to the basilar. Patent left PICA, right AICA, and bilateral SCA is are seen. The basilar artery is widely patent. Posterior communicating arteries are diminutive or absent. Both PCAs are patent without evidence of significant stenosis on the left. There are severe proximal P2 and P3 stenoses on the right. No aneurysm is identified. Venous sinuses: As permitted by contrast timing, patent. Anatomic variants: Azygos A2. Review of the MIP images confirms the above findings CT Brain Perfusion Findings: ASPECTS: 7 CBF (<30%) Volume: 66mL, however a core infarct is evident on CT and MRI Perfusion (Tmax>6.0s) volume: 6mL Mismatch Volume: The region of prolonged Tmax corresponds to the hypodensity on CT and diffusion abnormality on MRI, and no significant penumbra is present Infarction Location: Left MCA inferior division IMPRESSION: 1. Left MCA M2 inferior division occlusion. 2. Associated moderate-sized acute left MCA infarct without significant penumbra. 3. 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 4. Widely patent cervical carotid and vertebral arteries. These results were communicated to Dr. Laurence Slate at 9:30 am on 10/05/2019 by text page via the [facility] messaging system. Electronically Signed   By: Sebastian Ache M.D.   On: 10/05/2019 09:46   ECHOCARDIOGRAM COMPLETE BUBBLE STUDY  Result Date: 10/05/2019    ECHOCARDIOGRAM REPORT   Patient Name:   SHARONNA VINJE Date of Exam: 10/05/2019 Medical Rec #:  [id number]  Height:       62.0 in Accession #:    [id number]     Weight:       138.7 lb Date of Birth:  [birth year]      BSA:          1.636 m Patient Age:    64 years       BP:           150/61 mmHg Patient Gender: F              HR:           75 bpm. Exam Location:  [facility] Procedure: 2D Echo, Color Doppler, Cardiac Doppler and Saline Contrast Bubble            Study Indications:     I163.9  Stroke  History:         Patient has no prior history of Echocardiogram examinations.                  Signs/Symptoms:Confusion. No medical history.  Sonographer:     Humphrey Rolls RDCS (AE) Referring Phys:  [id number] Earl Lites Maysun Meditz Diagnosing Phys: Adrian Blackwater MD IMPRESSIONS  1. Left ventricular ejection fraction, by estimation, is 40 to 45%. The left ventricle has mildly decreased function. The left ventricle demonstrates regional wall motion abnormalities (see scoring diagram/findings for description). The left ventricular  internal cavity size was mildly dilated. Left ventricular diastolic parameters are consistent with Grade I diastolic dysfunction (impaired relaxation).  2. Right ventricular systolic function is normal. The right ventricular size is normal.  3. The mitral valve is normal in structure and function. Trivial mitral valve regurgitation. No evidence of mitral stenosis.  4. The aortic valve is normal in structure and function. Aortic valve regurgitation is not visualized. No aortic stenosis is present.  5. The inferior vena cava is normal in size with greater than 50% respiratory variability, suggesting right atrial pressure of 3 mmHg. FINDINGS  Left Ventricle: Left ventricular ejection fraction, by estimation, is 40 to 45%. The left ventricle has mildly decreased function. The left ventricle demonstrates regional wall motion abnormalities. The left ventricular internal cavity size was mildly dilated. There is no left ventricular hypertrophy. Left ventricular diastolic parameters are consistent with Grade I diastolic dysfunction (impaired relaxation).  LV Wall Scoring: The mid anteroseptal segment and mid inferoseptal segment are normal. Right Ventricle: The right ventricular size is normal. No increase in right ventricular wall thickness. Right ventricular systolic function is normal. Left Atrium: Left atrial size was normal in size. Right Atrium: Right atrial size was normal in size.  Pericardium: There is no evidence of pericardial effusion. Mitral Valve: The mitral valve is normal in structure and function. Normal mobility of the mitral valve leaflets. Trivial mitral valve regurgitation. No evidence of mitral valve stenosis. MV peak gradient, 2.6 mmHg. The mean mitral valve gradient is 1.0 mmHg. Tricuspid Valve: The tricuspid valve is normal in structure. Tricuspid valve regurgitation is trivial. No evidence of tricuspid stenosis. Aortic Valve: The aortic valve is normal in structure and function. Aortic valve regurgitation is not visualized. No aortic stenosis is present. Aortic valve mean gradient measures 3.0 mmHg. Aortic valve peak gradient measures 7.2 mmHg. Aortic valve area, by VTI measures 2.07 cm. Pulmonic Valve: The pulmonic valve was normal in structure. Pulmonic valve regurgitation is not visualized. No evidence of pulmonic stenosis. Aorta: The aortic root is normal in size and structure. Venous: The inferior vena cava is normal in size with greater  than 50% respiratory variability, suggesting right atrial pressure of 3 mmHg. IAS/Shunts: No atrial level shunt detected by color flow Doppler. Agitated saline contrast was given intravenously to evaluate for intracardiac shunting.  LEFT VENTRICLE PLAX 2D LVIDd:         3.99 cm  Diastology LVIDs:         3.22 cm  LV e' lateral:   9.36 cm/s LV PW:         1.00 cm  LV E/e' lateral: 6.4 LV IVS:        0.86 cm  LV e' medial:    7.29 cm/s LVOT diam:     1.90 cm  LV E/e' medial:  8.2 LV SV:         55 LV SV Index:   34 LVOT Area:     2.84 cm  RIGHT VENTRICLE RV Basal diam:  2.76 cm LEFT ATRIUM             Index       RIGHT ATRIUM          Index LA diam:        3.40 cm 2.08 cm/m  RA Area:     9.13 cm LA Vol (A2C):   22.7 ml 13.87 ml/m RA Volume:   17.00 ml 10.39 ml/m LA Vol (A4C):   55.1 ml 33.68 ml/m LA Biplane Vol: 38.4 ml 23.47 ml/m  AORTIC VALVE                   PULMONIC VALVE AV Area (Vmax):    2.12 cm    PV Vmax:       1.05 m/s  AV Area (Vmean):   2.10 cm    PV Vmean:      66.900 cm/s AV Area (VTI):     2.07 cm    PV VTI:        0.219 m AV Vmax:           134.00 cm/s PV Peak grad:  4.4 mmHg AV Vmean:          87.200 cm/s PV Mean grad:  2.0 mmHg AV VTI:            0.267 m AV Peak Grad:      7.2 mmHg AV Mean Grad:      3.0 mmHg LVOT Vmax:         100.00 cm/s LVOT Vmean:        64.700 cm/s LVOT VTI:          0.195 m LVOT/AV VTI ratio: 0.73  AORTA Ao Root diam: 2.60 cm MITRAL VALVE MV Area (PHT): 3.60 cm    SHUNTS MV Peak grad:  2.6 mmHg    Systemic VTI:  0.20 m MV Mean grad:  1.0 mmHg    Systemic Diam: 1.90 cm MV Vmax:       0.81 m/s MV Vmean:      57.1 cm/s MV Decel Time: 211 msec MV E velocity: 60.10 cm/s MV A velocity: 80.00 cm/s MV E/A ratio:  0.75 Neoma Laming MD Electronically signed by Neoma Laming MD Signature Date/Time: 10/05/2019/2:56:18 PM    Final         Scheduled Meds: . aspirin  300 mg Rectal Daily   Or  . aspirin EC  325 mg Oral Daily  . enoxaparin (LOVENOX) injection  40 mg Subcutaneous Q24H  . rosuvastatin  40 mg Oral q1800   Continuous Infusions:    LOS: 0  days    Time spent: 5 minutes    Alberteen Sam, MD [facility] 10/05/2019, 4:22 PM     Please page though AMION or Epic secure chat:  For [employer], Higher education careers adviser

## 2019-10-05 NOTE — ED Notes (Signed)
Family updated on pts plan of care and admission.

## 2019-10-05 NOTE — Evaluation (Signed)
Physical Therapy Evaluation Patient Details Name: Robin Mosley MRN: [id number] DOB: 1955-12-07 Today's Date: 10/05/2019   History of Present Illness  64 y.o. female with PMH of devrticulitis, IBS presents to the ED with altered mental status when family woke her up at 3AM to take her to the airport.  Patient's last known normal was 9 PM when she went to sleep.  When family went to get her up, they found her wandering in the house acting very confused and not making sense while speaking.  Patient was [facility] ED courseA stat CT head was performed which showed early acute ischemic stroke changes in the left MCA territory.  An MRI brain was subsequently performed which showed a large left MCA infarct.  Patient was admitted to hospital service and started on aspirin.  She was outside the window for receiving TPA.  Clinical Impression  Pt with significant expressive aphasia t/o session, some PLOF information gathered from phone call to daughter post session.  Pt, frankly, did relatively well with PT exam (walking around the nurses' station w/o AD) but did have significant coordination/apraxia/fine and gross motor limitations on the R (pt is L handed).  She displayed decreased strength and quality of movement but displayed generally functional use with extra time and obvious extra effort.  She was very motivated to do all she could but clearly frustrated with her inability to express herself well (both verbally and with attempts at witting).  Daughter reports that pt was independent at baseline and that she is only temporarily staying with her - during conversation PT did encourage her to plan on being around for at least a few weeks as pt returns home to insure that she is able to safely transition home and to assess what further assistance may be needed as she recovers.  Pt had no LOBs but showed safety awareness, R neglect and some impulsivity what necessitate close (24/7)  supervision when first going home.      Follow Up Recommendations Home health PT;Supervision/Assistance - 24 hour    Equipment Recommendations  None recommended by PT    Recommendations for Other Services       Precautions / Restrictions Precautions Precautions: Fall Restrictions Weight Bearing Restrictions: No      Mobility  Bed Mobility Overal bed mobility: Modified Independent             General bed mobility comments: Pt able to get to EOB w/o assist, only minimal (processing?) delay once cued  Transfers Overall transfer level: Modified independent Equipment used: Rolling walker (2 wheeled) Transfers: Sit to/from Stand Sit to Stand: Min guard         General transfer comment: CGA without AD, pt slightly impulsive with transfers and functional mobility  Ambulation/Gait Ambulation/Gait assistance: Supervision Gait Distance (Feet): 200 Feet Assistive device: None       General Gait Details: first ~30 ft with RW, then able to transtion to no AD and able to maintain safety.  She showed some minimal R sided neglect but maintained consistent but slow gait w/o no LOBs.  She showed good effort and though she needed constant cuing for directions, safety and general awareness.  Pt's vitals stable and appropriate.  Stairs            Wheelchair Mobility    Modified Rankin (Stroke Patients Only)       Balance Overall balance assessment: Needs assistance Sitting-balance support: No upper extremity supported;Feet unsupported Sitting balance-Leahy Scale: Good Sitting  balance - Comments: one LOB sitting EOB, pt quickly recovered   Standing balance support: No upper extremity supported Standing balance-Leahy Scale: Fair Standing balance comment: able to maintain balance statically, rare stagger steps with ambulation in hallway w/o ADs with no overt LOBs                             Pertinent Vitals/Pain Pain Assessment: No/denies pain Pain  Location: Pt denies pain, but continually touching R eye/forehead with hand and grimacing during eval Pain Intervention(s): Monitored during session    [place] expects to be discharged to:: Private residence Living Arrangements: Children(daughter had planned on moving back to [place] next month) Available Help at Discharge: Family;Friend(s);Neighbor Type of Home: Apartment Home Access: Level entry     Home Layout: Two level(daughter states that she could stay on first floor)        Prior Function Level of Independence: Independent         Comments: 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 LUE.  Pt attempted to complete finger-thumb opposition with RUE with increased time, but was only able to oppose digits 2&3 with thumb.  Pt able to use B hands to complete functional tasks. RUE Sensation: decreased light touch RUE Coordination: decreased fine motor;decreased gross motor    Lower Extremity Assessment Lower Extremity Assessment: Overall WFL for tasks assessed(R grossly 4-/5, L grossly 5/5)       Communication   Communication: Expressive difficulties(pt very much struggled to express thoughts at all)  Cognition Arousal/Alertness: Awake/alert Behavior During Therapy: Impulsive Overall Cognitive Status: Impaired/Different from baseline Area of Impairment: Following commands;Safety/judgement                       Following Commands: Follows one step commands inconsistently Safety/Judgement: Decreased awareness of safety;Decreased awareness of deficits     General Comments: Pt with intermittent difficulty following one step command, was impulsive in functional ambulation and transfers after verbal cues for safety from OTR       General Comments      Exercises Other Exercises Other Exercises: provided education regarding safety and fall precautions, self care, functional transfers and mobility, communication strategies, and OT role and POC   Assessment/Plan    PT Assessment Patient needs continued PT services  PT Problem List Decreased strength;Decreased range of motion;Decreased activity tolerance;Decreased balance;Decreased safety awareness;Decreased knowledge of use of DME;Decreased cognition;Decreased mobility;Decreased coordination;Decreased knowledge of precautions;Impaired sensation       PT Treatment Interventions Gait training;Stair training;Functional mobility training;Therapeutic activities;Therapeutic exercise;Balance training;Neuromuscular re-education;Cognitive remediation;Patient/family education    PT Goals (Current goals can be found in the Care Plan section)  Acute Rehab PT Goals Patient Stated Goal: unable to state PT Goal Formulation: Patient unable to participate in goal setting Time For Goal Achievement: 10/19/19 Potential to Achieve Goals: Good    Frequency 7X/week   Barriers to discharge        Co-evaluation               AM-PAC PT "6 Clicks" Mobility  Outcome Measure Help needed turning from your back to your side while in a flat bed without using bedrails?: None Help needed moving from lying on your back to  sitting on the side of a flat bed without using bedrails?: None Help needed moving to and from a bed to a chair (including a wheelchair)?: A Little Help needed standing up from a chair using your arms (e.g., wheelchair or bedside chair)?: None Help needed to walk in hospital room?: A Little Help needed climbing 3-5 steps with a railing? : A Little 6 Click Score: 21    End of Session Equipment Utilized During Treatment: Gait belt Activity Tolerance: Patient tolerated treatment well Patient left: with chair alarm set;with call bell/phone within reach Nurse  Communication: Mobility status PT Visit Diagnosis: Hemiplegia and hemiparesis;Difficulty in walking, not elsewhere classified (R26.2);Apraxia (R48.2);Muscle weakness (generalized) (M62.81) Hemiplegia - Right/Left: Right Hemiplegia - dominant/non-dominant: Non-dominant Hemiplegia - caused by: Cerebral infarction    Time: 1134-1207 PT Time Calculation (min) (ACUTE ONLY): 33 min   Charges:   PT Evaluation $PT Eval Low Complexity: 1 Low PT Treatments $Gait Training: 8-22 mins        Malachi Pro, DPT 10/05/2019, 3:32 PM

## 2019-10-05 NOTE — ED Provider Notes (Signed)
[facility] Emergency Department Provider Note  ____________________________________________  Time seen: Approximately 4:17 AM  I have reviewed the triage vital signs and the nursing notes.   HISTORY  Chief Complaint Altered Mental Status  Level 5 caveat:  Portions of the history and physical were unable to be obtained due to AMS   HPI Robin Mosley is a 64 y.o. female history of diverticulitis, IBS, seasonal allergies who presents for evaluation of confusion.  History is taken from patient's daughter over the phone.  According to her patient was in her usual state of health at 9 PM when she went to bed.  The entire family was waking up this morning at 3 AM to take a family member to the airport when patient was found wandering through the house with difficulty communicating and extremely confused.  Patient is unable to provide any history at this time.   Past Medical History:  Diagnosis Date  . Diverticulitis   . IBS (irritable bowel syndrome)   . Seasonal allergies     Patient Active Problem List   Diagnosis Date Noted  . Acute ischemic stroke (HCC) 10/05/2019    Past Surgical History:  Procedure Laterality Date  . CARPAL TUNNEL RELEASE    . CESAREAN SECTION    . CHOLECYSTECTOMY      Prior to Admission medications   Medication Sig Start Date End Date Taking? Authorizing Provider  diphenhydrAMINE (BENADRYL) 25 MG tablet Take 25 mg by mouth every 6 (six) hours as needed.    [provider]  meloxicam (MOBIC) 15 MG tablet Take 1 tablet (15 mg total) by mouth daily as needed for pain. 03/27/19   Tommie Sams, DO  predniSONE (STERAPRED UNI-PAK 21 TAB) 10 MG (21) TBPK tablet Take 6 tablets daily for the first 2 days.  Take one last tablet every 2 days until medication runs out. 03/30/19   Orvil Feil, PA-C  tiZANidine (ZANAFLEX) 4 MG tablet Take 1 tablet (4 mg total) by mouth every 8 (eight) hours as needed for muscle spasms. 03/27/19    Tommie Sams, DO    Allergies Sulfa antibiotics and Tetracyclines & related  Family History  Problem Relation Age of Onset  . Heart disease Mother     Social History Social History   Tobacco Use  . Smoking status: Current Every Day Smoker    Types: Cigarettes  . Smokeless tobacco: Never Used  Substance Use Topics  . Alcohol use: Yes    Comment: social  . Drug use: No    Review of Systems  Constitutional: Negative for fever. + confusion Neurological: + aphasia ____________________________________________   PHYSICAL EXAM:  VITAL SIGNS: ED Triage Vitals [10/05/19 0413]  Enc Vitals Group     BP (!) 155/76     Pulse Rate 78     Resp 18     Temp 98.1 F (36.7 C)     Temp Source Oral     SpO2 98 %     Weight      Height      Head Circumference      Peak Flow      Pain Score      Pain Loc      Pain Edu?      Excl. in GC?     Constitutional: Alert and oriented to self only.  HEENT:      Head: Normocephalic and atraumatic.         Eyes: Conjunctivae are  normal. Sclera is non-icteric.       Mouth/Throat: Mucous membranes are moist.       Neck: Supple with no signs of meningismus. Cardiovascular: Regular rate and rhythm. No murmurs, gallops, or rubs. Respiratory: Normal respiratory effort. Lungs are clear to auscultation bilaterally. No wheezes, crackles, or rhonchi.  Gastrointestinal: Soft, non tender, and non distended Musculoskeletal: No edema, cyanosis, or erythema of extremities. Neurologic: Expressive aphasia, face is symmetric, PERRL, EOMI, R sided neglect, 4/5 strength on RLE, 5/5 in all other extremities.  Skin: Skin is warm, dry and intact. No rash noted.   NIH Stroke Scale  Interval: Baseline Time: 5:39 AM Person Administering Scale: [place]  Administer stroke scale items in the order listed. Record performance in each category after each subscale exam. Do not go back and change scores. Follow directions provided for each exam  technique. Scores should reflect what the patient does, not what the clinician thinks the patient can do. The clinician should record answers while administering the exam and work quickly. Except where indicated, the patient should not be coached (i.e., repeated requests to patient to make a special effort).   1a  Level of consciousness: 0=alert; keenly responsive  1b. LOC questions:  2=Performs neither task correctly  1c. LOC commands: 0=Performs both tasks correctly  2.  Best Gaze: 0=normal  3.  Visual: 0=No visual loss  4. Facial Palsy: 0=Normal symmetric movement  5a.  Motor left arm: 0=No drift, limb holds 90 (or 45) degrees for full 10 seconds  5b.  Motor right arm: 0=No drift, limb holds 90 (or 45) degrees for full 10 seconds  6a. motor left leg: 0=No drift, limb holds 90 (or 45) degrees for full 10 seconds  6b  Motor right leg:  2=Some effort against gravity, limb cannot get to or maintain (if cured) 90 (or 45) degrees, drifts down to bed, but has some effort against gravity  7. Limb Ataxia: 0=Absent  8.  Sensory: 1=Mild to moderate sensory loss; patient feels pinprick is less sharp or is dull on the affected side; there is a loss of superficial pain with pinprick but patient is aware She is being touched  9. Best Language:  1=Mild to moderate aphasia; some obvious loss of fluency or facility of comprehension without significant limitation on ideas expressed or form of expression.  10. Dysarthria: 0=Normal  11. Extinction and Inattention: 2=Profound hemi-inattention or hemi-inattention to more than one modality. Does not recognize own hand or orients only to one side of space   Total:   8    ____________________________________________   LABS (all labs ordered are listed, but only abnormal results are displayed)  Labs Reviewed  COMPREHENSIVE METABOLIC PANEL - Abnormal; Notable for the following components:      Result Value   Potassium 3.3 (*)    Glucose, Bld 100 (*)    Calcium  8.7 (*)    All other components within normal limits  URINE DRUG SCREEN, QUALITATIVE ([facility] ONLY) - Abnormal; Notable for the following components:   Cannabinoid 50 Ng, Ur [place] POSITIVE (*)    All other components within normal limits  URINALYSIS, ROUTINE W REFLEX MICROSCOPIC - Abnormal; Notable for the following components:   Color, Urine YELLOW (*)    APPearance HAZY (*)    Hgb urine dipstick MODERATE (*)    All other components within normal limits  RESPIRATORY PANEL BY RT PCR (FLU A&B, COVID)  ETHANOL  PROTIME-INR  APTT  CBC  DIFFERENTIAL  CBC  MAGNESIUM   ____________________________________________  EKG  ED ECG REPORT I, Rudene Re, the attending physician, personally viewed and interpreted this ECG.  Normal sinus rhythm, rate of 78, normal intervals, normal axis, no ST elevations or depressions.  Normal EKG. ____________________________________________  RADIOLOGY  I have personally reviewed the images performed during this visit and I agree with the Radiologist's read.   Interpretation by Radiologist:  CT HEAD WO CONTRAST  Result Date: 10/05/2019 CLINICAL DATA:  Confusion.  Right-sided weakness. EXAM: CT HEAD WITHOUT CONTRAST TECHNIQUE: Contiguous axial images were obtained from the base of the skull through the vertex without intravenous contrast. COMPARISON:  None. FINDINGS: Brain: Sizable area of cytotoxic edema in the superficial left temporal lobe and even more defined in the left frontal parietal region, inferior division MCA territory. No acute hemorrhage. No hydrocephalus or shift. Elsewhere the brain has a normal appearance Vascular: No hyperdense vessel. Skull: Normal Sinuses/Orbits: Normal These results were called by telephone at the time of interpretation on 10/05/2019 at 4:38 am to provider [facility] , who verbally acknowledged these results. IMPRESSION: Acute infarct affecting the superficial left temporal lobe and frontal parietal junction,  inferior division MCA territory. No acute hemorrhage. Electronically Signed   By: Monte Fantasia M.D.   On: 10/05/2019 04:38      ____________________________________________   PROCEDURES  Procedure(s) performed: None Procedures Critical Care performed: None  ____________________________________________   INITIAL IMPRESSION / ASSESSMENT AND PLAN / ED COURSE   64 y.o. female history of diverticulitis, IBS, seasonal allergies who presents for evaluation of confusion, expressive aphasia and right-sided neglect concerning for stroke.  Last seen normal at 9 PM.  Outside the window with TPA.  NIH stroke scale of 8 on arrival.  History gathered mostly from daughter as patient has expressive aphasia and and is very confused.  CT head and labs are pending.  Blood glucose is normal.  Clinical Course as of Oct 04 537  Thu Oct 05, 2019  0451 CT consistent with left ischemic MCA stroke.  Patient's daughter has been updated over the phone.  Patient passed swallow eval and was given oral aspirin.  Will consult hospitalist for admission.   [CV]    Clinical Course User Index [CV] Alfred Levins [place], MD      As part of my medical decision making, I reviewed the following data within the [place] History obtained from family, Nursing notes reviewed and incorporated, Labs reviewed , EKG interpreted , Old chart reviewed, Radiograph reviewed , Discussed with admitting physician , Notes from prior ED visits and [place] Controlled Substance Database   Please note:  Patient was evaluated in Emergency Department today for the symptoms described in the history of present illness. Patient was evaluated in the context of the global COVID-19 pandemic, which necessitated consideration that the patient might be at risk for infection with the SARS-CoV-2 virus that causes COVID-19. Institutional protocols and algorithms that pertain to the evaluation of patients at risk for COVID-19 are in a state of  rapid change based on information released by regulatory bodies including the CDC and federal and state organizations. These policies and algorithms were followed during the patient's care in the ED.  Some ED evaluations and interventions may be delayed as a result of limited staffing during the pandemic.   ____________________________________________   FINAL CLINICAL IMPRESSION(S) / ED DIAGNOSES   Final diagnoses:  Acute ischemic stroke ([place])      NEW MEDICATIONS STARTED DURING THIS VISIT:  ED Discharge Orders  None       Note:  This document was prepared using Dragon voice recognition software and may include unintentional dictation errors.    Don Perking, [place], MD 10/05/19 [phone]

## 2019-10-05 NOTE — ED Triage Notes (Signed)
Pt arrived via EMS from daughters house where pt resides. Pt found wondering house, confused and unable to communicate normally. Pt arrived to ED with right sided weakness. Pt unable to communicate thoughts. When shown pictures and words, pt unable to identify. Pt able to ambulate to bathroom with assistance. MD at bedside. Last known well time at approx: 2100.

## 2019-10-05 NOTE — ED Notes (Addendum)
Report provided and pt transported to 1A (Pt going to MRI on way to 1A)

## 2019-10-05 NOTE — Progress Notes (Signed)
*  PRELIMINARY RESULTS* Echocardiogram 2D Echocardiogram has been performed.  Joanette Gula Jaques Mineer 10/05/2019, 10:53 AM

## 2019-10-05 NOTE — H&P (Addendum)
[facility] [place] NAME: Robin Mosley    MR#:  [id number]  DATE OF BIRTH:  [birth year]  DATE OF ADMISSION:  10/05/2019  PRIMARY CARE PHYSICIAN: Patient, No Pcp Per   REQUESTING/REFERRING PHYSICIAN: Gonzella Lex, MD  CHIEF COMPLAINT:   Chief Complaint  Patient presents with  . Altered Mental Status    HISTORY OF PRESENT ILLNESS:  Robin Mosley  is a 64 y.o. female with a known history of *diverticulitis and IBS, presented to the emergency room with acute onset of altered mental status with confusion around 3 AM when entire family was waking up to [place] a [place] to the airport.  The patient was found wandering through the house, extremely confused with difficulty in communication.  She was last seen in her normal health status at 9 PM when she went to bed.  She denies any headache or dizziness or paresthesias.  She denies any chest pain or dyspnea or palpitations.  No cough or wheezing or hemoptysis.  No tinnitus or vertigo.  No urinary or stool incontinence.  She was very slow to respond to questions was globally confused and disoriented.  History was earlier obtained from her daughter over the phone.  Upon presentation to the emergency room, blood pressure was 155/76 with otherwise normal vital signs.  Labs revealed mild hypokalemia and were otherwise unremarkable.  Alcohol levels less than 10.  Urine drug screen was positive for cannabinoids.  Noncontrasted Head CT scan revealed acute infarct affecting the superficial left temporal lobe and fronto- parietal junction in the inferior division of the MCA territory with no acute hemorrhage.EKG showed normal sinus rhythm with rate of 78 with borderline right axis deviation.  The patient was given 4 baby aspirin.  To a medically monitored bed for further evaluation and management. PAST MEDICAL HISTORY:   Past Medical History:  Diagnosis Date  . Diverticulitis   . IBS (irritable bowel syndrome)   .  Seasonal allergies     PAST SURGICAL HISTORY:   Past Surgical History:  Procedure Laterality Date  . CARPAL TUNNEL RELEASE    . CESAREAN SECTION    . CHOLECYSTECTOMY      SOCIAL HISTORY:   Social History   Tobacco Use  . Smoking status: Current Every Day Smoker    Types: Cigarettes  . Smokeless tobacco: Never Used  Substance Use Topics  . Alcohol use: Yes    Comment: social    FAMILY HISTORY:   Family History  Problem Relation Age of Onset  . Heart disease Mother     DRUG ALLERGIES:   Allergies  Allergen Reactions  . Sulfa Antibiotics Hives  . Tetracyclines & Related Hives    REVIEW OF SYSTEMS:   ROS As per history of present illness. All pertinent systems were reviewed above. Constitutional,  HEENT, cardiovascular, respiratory, GI, GU, musculoskeletal, neuro, psychiatric, endocrine,  integumentary and hematologic systems were reviewed and are otherwise  negative/unremarkable except for positive findings mentioned above in the HPI.   MEDICATIONS AT HOME:   Prior to Admission medications   Medication Sig Start Date End Date Taking? Authorizing Provider  diphenhydrAMINE (BENADRYL) 25 MG tablet Take 25 mg by mouth every 6 (six) hours as needed.    [provider]  meloxicam (MOBIC) 15 MG tablet Take 1 tablet (15 mg total) by mouth daily as needed for pain. 03/27/19   Coral Spikes, DO  predniSONE (STERAPRED UNI-PAK 21 TAB) 10 MG (21) TBPK tablet Take 6  tablets daily for the first 2 days.  Take one last tablet every 2 days until medication runs out. 03/30/19   Orvil Feil, PA-C  tiZANidine (ZANAFLEX) 4 MG tablet Take 1 tablet (4 mg total) by mouth every 8 (eight) hours as needed for muscle spasms. 03/27/19   Tommie Sams, DO      VITAL SIGNS:  Blood pressure (!) 152/59, pulse 78, temperature 98.1 F (36.7 C), temperature source Oral, resp. rate 18, SpO2 98 %.  PHYSICAL EXAMINATION:  Physical Exam  GENERAL:  64 y.o.-year-old Caucasian female  patient lying in the bed with no acute distress.  She was alert but disoriented x3 EYES: Pupils equal, round, reactive to light and accommodation. No scleral icterus. Extraocular muscles intact.  HEENT: Head atraumatic, normocephalic. Oropharynx and nasopharynx clear.  NECK:  Supple, no jugular venous distention. No thyroid enlargement, no tenderness.  LUNGS: Normal breath sounds bilaterally, no wheezing, rales,rhonchi or crepitation. No use of accessory muscles of respiration.  CARDIOVASCULAR: Regular rate and rhythm, S1, S2 normal. No murmurs, rubs, or gallops.  ABDOMEN: Soft, nondistended, nontender. Bowel sounds present. No organomegaly or mass.  EXTREMITIES: No pedal edema, cyanosis, or clubbing.  NEUROLOGIC: Cranial nerves II through XII are intact.  No gaze preference.  Muscle strength 5/5 in both upper extremities, 3/5 in the right lower extremity and 5/5 in the left lower extremity.  She had no pronator drift.  Sensation intact. Gait not checked.  PSYCHIATRIC: The patient is alert and disoriented x 3.  Flat affect and good eye contact. SKIN: No obvious rash, lesion, or ulcer.   LABORATORY PANEL:   CBC Recent Labs  Lab 10/05/19 0419  WBC 7.8  HGB 13.4  HCT 40.3  PLT 292   ------------------------------------------------------------------------------------------------------------------  Chemistries  Recent Labs  Lab 10/05/19 0419  NA 142  K 3.3*  CL 105  CO2 28  GLUCOSE 100*  BUN 11  CREATININE 0.67  CALCIUM 8.7*  AST 16  ALT 13  ALKPHOS 81  BILITOT 0.4   ------------------------------------------------------------------------------------------------------------------  Cardiac Enzymes No results for input(s): TROPONINI in the last 168 hours. ------------------------------------------------------------------------------------------------------------------  RADIOLOGY:  CT HEAD WO CONTRAST  Result Date: 10/05/2019 CLINICAL DATA:  Confusion.  Right-sided  weakness. EXAM: CT HEAD WITHOUT CONTRAST TECHNIQUE: Contiguous axial images were obtained from the base of the skull through the vertex without intravenous contrast. COMPARISON:  None. FINDINGS: Brain: Sizable area of cytotoxic edema in the superficial left temporal lobe and even more defined in the left frontal parietal region, inferior division MCA territory. No acute hemorrhage. No hydrocephalus or shift. Elsewhere the brain has a normal appearance Vascular: No hyperdense vessel. Skull: Normal Sinuses/Orbits: Normal These results were called by telephone at the time of interpretation on 10/05/2019 at 4:38 am to provider [facility] , who verbally acknowledged these results. IMPRESSION: Acute infarct affecting the superficial left temporal lobe and frontal parietal junction, inferior division MCA territory. No acute hemorrhage. Electronically Signed   By: Marnee Spring M.D.   On: 10/05/2019 04:38      IMPRESSION AND PLAN:   1.  Acute ischemic stroke/cerebral infarction of the left temporal lobe and frontoparietal junction in the inferior division of MCA territory fusion and right-sided weakness with neglect.. -The patient will be admitted to an observation medically monitored bed.   -We will follow neuro checks q.4 hours for 24 hours.   -The patient will be placed on aspirin.   -Will obtain a brain MRI without contrast as well as bilateral carotid  Doppler and 2D echo with bubble study -A neurology consultation (when available) as well as physical/occupation/speech therapy consults will be obtained in a.m..    I notified Dr. Laurence Slate about the patient. -The patient will be placed on statin therapy and fasting lipids will be checked.  2.  Hypokalemia. -Potassium will be replaced and magnesium level will be checked.  3.  Tobacco abuse. -I counseled the patient for smoking cessation and the patient will receive further counseling here.  4.  DVT prophylaxis. -Subcutaneous Lovenox.  All the  records are reviewed and case discussed with ED provider. The plan of care was discussed in details with the patient (and family). I answered all questions. The patient agreed to proceed with the above mentioned plan. Further management will depend upon hospital course.   CODE STATUS: Full code  TOTAL TIME TAKING CARE OF THIS PATIENT: 50 minutes.    Hannah Beat M.D on 10/05/2019 at 5:20 AM  [facility]   From 7 PM-7 AM, contact night-coverage [email or web address]  CC: Primary care physician; Patient, No Pcp Per   Note: This dictation was prepared with Dragon dictation along with smaller phrase technology. Any transcriptional errors that result from this process are unintentional.

## 2019-10-06 ENCOUNTER — Encounter
Admission: EM | Disposition: A | Discharge status: Home Health | Payer: Self-pay | Source: Home / Self Care | Attending: Family Medicine

## 2019-10-06 ENCOUNTER — Inpatient Hospital Stay (HOSPITAL_COMMUNITY)
Admit: 2019-10-06 | Discharge: 2019-10-06 | Disposition: A | Discharge status: Home / Self Care | Payer: Self-pay | Attending: Cardiovascular Disease | Admitting: Cardiovascular Disease

## 2019-10-06 DIAGNOSIS — I6389 Other cerebral infarction: Secondary | ICD-10-CM

## 2019-10-06 DIAGNOSIS — I69920 Aphasia following unspecified cerebrovascular disease: Secondary | ICD-10-CM

## 2019-10-06 DIAGNOSIS — E782 Mixed hyperlipidemia: Secondary | ICD-10-CM

## 2019-10-06 DIAGNOSIS — I639 Cerebral infarction, unspecified: Secondary | ICD-10-CM

## 2019-10-06 DIAGNOSIS — I420 Dilated cardiomyopathy: Secondary | ICD-10-CM

## 2019-10-06 DIAGNOSIS — I361 Nonrheumatic tricuspid (valve) insufficiency: Secondary | ICD-10-CM

## 2019-10-06 HISTORY — PX: TEE WITHOUT CARDIOVERSION: SHX5443

## 2019-10-06 LAB — BASIC METABOLIC PANEL
Anion gap: 6 (ref 5–15)
BUN: 8 mg/dL (ref 8–23)
CO2: 28 mmol/L (ref 22–32)
Calcium: 8.7 mg/dL — ABNORMAL LOW (ref 8.9–10.3)
Chloride: 107 mmol/L (ref 98–111)
Creatinine, Ser: 0.66 mg/dL (ref 0.44–1.00)
GFR calc Af Amer: >60 mL/min (ref >60)
GFR calc non Af Amer: >60 mL/min (ref >60)
Glucose, Bld: 94 mg/dL (ref 70–99)
Potassium: 4.2 mmol/L (ref 3.5–5.1)
Sodium: 141 mmol/L (ref 135–145)

## 2019-10-06 SURGERY — ECHOCARDIOGRAM, TRANSESOPHAGEAL
Anesthesia: Moderate Sedation

## 2019-10-06 MED ORDER — BUTAMBEN-TETRACAINE-BENZOCAINE 2-2-14 % EX AERO
INHALATION_SPRAY | CUTANEOUS | Status: AC
  Administered 2019-10-06: 12:00:00 3
  Filled 2019-10-06: qty 5

## 2019-10-06 MED ORDER — LIDOCAINE VISCOUS HCL 2 % MT SOLN
OROMUCOSAL | Status: AC
  Administered 2019-10-06: 12:00:00 15 mL
  Filled 2019-10-06: qty 15

## 2019-10-06 MED ORDER — FENTANYL CITRATE (PF) 100 MCG/2ML IJ SOLN
INTRAMUSCULAR | Status: AC | PRN
  Administered 2019-10-06 (×6): 25 ug via INTRAVENOUS

## 2019-10-06 MED ORDER — FENTANYL CITRATE (PF) 100 MCG/2ML IJ SOLN
INTRAMUSCULAR | Status: AC
  Filled 2019-10-06: qty 2

## 2019-10-06 MED ORDER — SODIUM CHLORIDE FLUSH 0.9 % IV SOLN
INTRAVENOUS | Status: AC
  Filled 2019-10-06: qty 10

## 2019-10-06 MED ORDER — SODIUM CHLORIDE 0.9 % IV SOLN: INTRAVENOUS | Status: DC

## 2019-10-06 MED ORDER — MIDAZOLAM HCL 5 MG/5ML IJ SOLN
INTRAMUSCULAR | Status: AC
  Filled 2019-10-06: qty 5

## 2019-10-06 MED ORDER — MIDAZOLAM HCL 5 MG/5ML IJ SOLN
INTRAMUSCULAR | Status: AC | PRN
  Administered 2019-10-06 (×5): 1 mg via INTRAVENOUS

## 2019-10-06 NOTE — Progress Notes (Signed)
*  PRELIMINARY RESULTS* Echocardiogram Echocardiogram Transesophageal has been performed.  Cristela Blue 10/06/2019, 12:32 PM

## 2019-10-06 NOTE — Progress Notes (Signed)
SLP Cancellation Note  Patient Details Name: Robin Mosley MRN: [id number] DOB: [birth year]   Cancelled treatment:       Reason Eval/Treat Not Completed: Patient at procedure or test/unavailable  Dollene Primrose, MS/CCC- SLP  Leandrew Koyanagi 10/06/2019, 11:23 AM

## 2019-10-06 NOTE — Progress Notes (Signed)
Transesophageal Echocardiogram :  Indication: CVA Requesting/ordering  physician: Ival Bible  Procedure: Benzocaine spray x2 and 2 mls x 2 of viscous lidocaine were given orally to provide local anesthesia to the oropharynx. The patient was positioned supine on the left side, bite block provided. The patient was moderately sedated with the doses of versed and fentanyl as detailed below.  Using digital technique an omniplane probe was advanced into the distal esophagus without incident.   Moderate sedation: 1. Sedation used:  Versed: 5 mg, Fentanyl: 150 ug iv 2. Time administered:  11:35 am   Time when patient started recovery: 12:10 3. I was face to face during this time  [id number]   See report in EPIC  for complete details: In brief, transgastric imaging revealed normal LV function with no RWMAs and no mural apical thrombus.  .  Estimated ejection fraction was 60%.  Right sided cardiac chambers were normal with no evidence of pulmonary hypertension.  Imaging of the septum showed no ASD or VSD Bubble study was negative for shunt 2D and color flow confirmed no PFO  The LA was well visualized in orthogonal views.  There was no spontaneous contrast and no thrombus in the LA and LA appendage   The descending thoracic aorta had mild  mural aortic debris with no evidence of aneurysmal dilation or disection   Julien Nordmann 10/06/2019 12:29 PM

## 2019-10-06 NOTE — Progress Notes (Signed)
OT Cancellation Note  Patient Details Name: Robin Mosley MRN: [id number] DOB: 11-06-1955   Cancelled Treatment:    Reason Eval/Treat Not Completed: Other (comment)  Patient approached in afternoon for session.  Patient with husband in room, ready to eat dinner.  Declined therapy at this time.  Will follow up as able.  Wynn Maudlin 10/06/2019, 4:50 PM

## 2019-10-06 NOTE — Consult Note (Signed)
Cardiology Consultation:   Patient ID: Robin Mosley MRN: [id number]; DOB: Nov 15, 1955  Admit date: 10/05/2019 Date of Consult: 10/06/2019  Primary Care Provider: Patient, No Pcp Per Primary Cardiologist:New to [facility] Physician requesting consult: Dr. Ival Bible Reason for consult: Cardiomyopathy, stroke   Patient Profile:   Robin Mosley is a 65 y.o. female with a hx of diverticulosis, IBS, presenting to the hospital yesterday with confusion, echocardiogram with depressed ejection fraction, brain imaging confirming stroke  History of Present Illness:   [name] was in her usual state of health, went to bed last night, was found by family after she was walking around at 3 AM, was having difficulty communicating, very confused On arrival to the emergency room had right-sided weakness, unable to communicate her thoughts, unable to identify pictures and words, was able to ambulate with assistance Was reportedly well at 9 PM night before CT scan confirming left ischemic MCA stroke She passed her swallow evaluation She was outside the window for TPA and was managed medically  Seen by neurology, MRI confirming large left MCA infarct, CT angio confirming left M2 occlusion, Also severe proximal P2 and P3 stenosis on the right Carotids widely patent Lab work reviewed Positive for marijuana  Improvement in her mentation overnight, this morning per nursing is less confused, verbal though some word finding difficulties    Heart Pathway Score:     Past Medical History:  Diagnosis Date  . Diverticulitis   . IBS (irritable bowel syndrome)   . Seasonal allergies     Past Surgical History:  Procedure Laterality Date  . CARPAL TUNNEL RELEASE    . CESAREAN SECTION    . CHOLECYSTECTOMY       Home Medications:  Prior to Admission medications   Medication Sig Start Date End Date Taking? Authorizing Provider  acetaminophen (TYLENOL) 500 MG tablet Take 500 mg by mouth every 6  (six) hours as needed for headache.   Yes [provider]    Inpatient Medications: Scheduled Meds: . aspirin  300 mg Rectal Daily   Or  . aspirin EC  325 mg Oral Daily  . enoxaparin (LOVENOX) injection  40 mg Subcutaneous Q24H  . fentaNYL      . fentaNYL      . midazolam      . rosuvastatin  40 mg Oral q1800  . sodium chloride flush       Continuous Infusions:  PRN Meds: acetaminophen **OR** acetaminophen (TYLENOL) oral liquid 160 mg/5 mL **OR** acetaminophen, senna-docusate  Allergies:    Allergies  Allergen Reactions  . Sulfa Antibiotics Hives  . Tetracyclines & Related Hives    Social History:   Social History   Socioeconomic History  . Marital status: Widowed    Spouse name: Not on file  . Number of children: Not on file  . Years of education: Not on file  . Highest education level: Not on file  Occupational History  . Not on file  Tobacco Use  . Smoking status: Current Every Day Smoker    Types: Cigarettes  . Smokeless tobacco: Never Used  Substance and Sexual Activity  . Alcohol use: Yes    Comment: social  . Drug use: No  . Sexual activity: Not on file  Other Topics Concern  . Not on file  Social History Narrative  . Not on file   Social Determinants of Health   Financial Resource Strain:   . Difficulty of Paying Living Expenses: Not on file  Food  Insecurity:   . Worried About Programme researcher, broadcasting/film/video in the Last Year: Not on file  . Ran Out of Food in the Last Year: Not on file  Transportation Needs:   . Lack of Transportation (Medical): Not on file  . Lack of Transportation (Non-Medical): Not on file  Physical Activity:   . Days of Exercise per Week: Not on file  . Minutes of Exercise per Session: Not on file  Stress:   . Feeling of Stress : Not on file  Social Connections:   . Frequency of Communication with Friends and Family: Not on file  . Frequency of Social Gatherings with Friends and Family: Not on file  . Attends Religious  Services: Not on file  . Active Member of Clubs or Organizations: Not on file  . Attends Banker Meetings: Not on file  . Marital Status: Not on file  Intimate Partner Violence:   . Fear of Current or Ex-Partner: Not on file  . Emotionally Abused: Not on file  . Physically Abused: Not on file  . Sexually Abused: Not on file    Family History:    Family History  Problem Relation Age of Onset  . Heart disease Mother      ROS:  Please see the history of present illness.  Review of Systems  Constitutional: Negative.   HENT: Negative.   Respiratory: Negative.   Cardiovascular: Negative.   Gastrointestinal: Negative.   Musculoskeletal: Negative.   Neurological: Negative.        Troubles with her speech, numbness/weakness right hand  Psychiatric/Behavioral: Negative.   All other systems reviewed and are negative.   Physical Exam/Data:   Vitals:   10/06/19 1215 10/06/19 1230 10/06/19 1245 10/06/19 1314  BP: 129/86 131/66 (!) 147/81 134/67  Pulse: 73 67 67 70  Resp: 12 15 15 17   Temp:    98.3 F (36.8 C)  TempSrc:    Oral  SpO2: 97% 93% 94% 98%  Weight:      Height:        Intake/Output Summary (Last 24 hours) at 10/06/2019 1437 Last data filed at 10/05/2019 2310 Gross per 24 hour  Intake --  Output 0 ml  Net 0 ml   Last 3 Weights 10/05/2019 03/30/2019 03/27/2019  Weight (lbs) 138 lb 10.7 oz 150 lb 145 lb  Weight (kg) 62.9 kg 68.04 kg 65.772 kg     Body mass index is 25.36 kg/m.  General:  Well nourished, well developed, in no acute distress HEENT: normal Lymph: no adenopathy Neck: no JVD Endocrine:  No thryomegaly Vascular: No carotid bruits; FA pulses 2+ bilaterally without bruits  Cardiac:  normal S1, S2; RRR; no murmur Lungs:  clear to auscultation bilaterally, no wheezing, rhonchi or rales  Abd: soft, nontender, no hepatomegaly  Ext: no edema Musculoskeletal:  No deformities, BUE and BLE strength normal and equal Skin: warm and dry  Neuro:   CNs 2-12 intact, no focal abnormalities noted, word finding difficulty Psych:  Normal affect   EKG:  The EKG was personally reviewed and demonstrates:   Normal sinus rhythm rate 78 bpm no significant ST-T wave changes  Telemetry:  Telemetry was personally reviewed and demonstrates: Normal sinus rhythm  Relevant CV Studies: TEE 1. Left ventricular ejection fraction, by estimation, is 60 to 65%. The  left ventricle has normal function. The left ventricle has no regional  wall motion abnormalities. Left ventricular diastolic parameters are  indeterminate.  2. Right ventricular systolic function is  normal. The right ventricular  size is normal.  3. No left atrial/left atrial appendage thrombus was detected.  4. The mitral valve is normal in structure and function. No evidence of  mitral valve regurgitation. No evidence of mitral stenosis.  5. The aortic valve is normal in structure and function. Aortic valve  regurgitation is trivial. No aortic stenosis is present.  6. There is mild (Grade II) plaque.  7. The inferior vena cava is normal in size with greater than 50%  respiratory variability, suggesting right atrial pressure of 3 mmHg.    Laboratory Data:  High Sensitivity Troponin:  No results for input(s): TROPONINIHS in the last 720 hours.   Chemistry Recent Labs  Lab 10/05/19 0419 10/06/19 0525  NA 142 141  K 3.3* 4.2  CL 105 107  CO2 28 28  GLUCOSE 100* 94  BUN 11 8  CREATININE 0.67 0.66  CALCIUM 8.7* 8.7*  GFRNONAA >60 >60  GFRAA >60 >60  ANIONGAP 9 6    Recent Labs  Lab 10/05/19 0419  PROT 7.5  ALBUMIN 3.9  AST 16  ALT 13  ALKPHOS 81  BILITOT 0.4   Hematology Recent Labs  Lab 10/05/19 0419  WBC 7.8  RBC 4.74  HGB 13.4  HCT 40.3  MCV 85.0  MCH 28.3  MCHC 33.3  RDW 13.2  PLT 292   BNPNo results for input(s): BNP, PROBNP in the last 168 hours.  DDimer No results for input(s): DDIMER in the last 168 hours.   Radiology/Studies:  CT ANGIO  HEAD W OR WO CONTRAST  Result Date: 10/05/2019 CLINICAL DATA:  Acute left MCA infarct. Confusion, expressive aphasia, and right-sided neglect. EXAM: CT ANGIOGRAPHY HEAD AND NECK CT PERFUSION BRAIN TECHNIQUE: Multidetector CT imaging of the head and neck was performed using the standard protocol during bolus administration of intravenous contrast. Multiplanar CT image reconstructions and MIPs were obtained to evaluate the vascular anatomy. Carotid stenosis measurements (when applicable) are obtained utilizing NASCET criteria, using the distal internal carotid diameter as the denominator. Multiphase CT imaging of the brain was performed following IV bolus contrast injection. Subsequent parametric perfusion maps were calculated using RAPID software. CONTRAST:  OMNIPAQUE IOHEXOL 350 MG/ML SOLN COMPARISON:  Head CT and MRI 10/05/2019 FINDINGS: CT HEAD FINDINGS Brain: A moderate-sized acute left MCA territory infarct is again seen involving the left temporal, parietal, and posterior frontal lobes. Cytotoxic edema has mildly increased compared to today's earlier CT, and the distribution of the infarct is unchanged from the MRI. No acute intracranial hemorrhage, midline shift, or extra-axial fluid collection is identified. The ventricles are normal in size. Vascular: Calcified atherosclerosis at the skull base. Skull: No fracture or suspicious osseous lesion. Sinuses/Orbits: Paranasal sinuses and mastoid air cells are clear. Unremarkable orbits. Other: None. ASPECTS [facility] Stroke Program Early CT Score) - Ganglionic level infarction (caudate, lentiform nuclei, internal capsule, insula, M1-M3 cortex): 5 - Supraganglionic infarction (M4-M6 cortex): 2 Total score (0-10 with 10 being normal): 7 Review of the MIP images confirms the above findings CTA NECK FINDINGS Aortic arch: Standard 3 vessel aortic arch with widely patent arch vessel origins. Right carotid system: Patent with minimal atherosclerotic plaque at the  carotid bifurcation. No evidence of stenosis or dissection. Left carotid system: Patent without evidence of stenosis or dissection. Vertebral arteries: Patent and codominant without evidence of stenosis or dissection. Skeleton: Moderately large anterior vertebral osteophytes at C5-6 and C6-7. Other neck: No evidence of cervical lymphadenopathy or mass. Upper chest: Clear lung apices. Review  of the MIP images confirms the above findings CTA HEAD FINDINGS Anterior circulation: The internal carotid arteries are patent from skull base to carotid termini with mild atherosclerotic plaque bilaterally not resulting in significant stenosis. The M1 segments are widely patent bilaterally, however there is occlusion of the left M2 inferior division near its origin. ACAs are patent with an azygos A2 configuration and no significant proximal stenosis. No aneurysm is identified. Posterior circulation: The intracranial vertebral arteries are widely patent to the basilar. Patent left PICA, right AICA, and bilateral SCA is are seen. The basilar artery is widely patent. Posterior communicating arteries are diminutive or absent. Both PCAs are patent without evidence of significant stenosis on the left. There are severe proximal P2 and P3 stenoses on the right. No aneurysm is identified. Venous sinuses: As permitted by contrast timing, patent. Anatomic variants: Azygos A2. Review of the MIP images confirms the above findings CT Brain Perfusion Findings: ASPECTS: 7 CBF (<30%) Volume: 0mL, however a core infarct is evident on CT and MRI Perfusion (Tmax>6.0s) volume: 29mL Mismatch Volume: The region of prolonged Tmax corresponds to the hypodensity on CT and diffusion abnormality on MRI, and no significant penumbra is present Infarction Location: Left MCA inferior division IMPRESSION: 1. Left MCA M2 inferior division occlusion. 2. Associated moderate-sized acute left MCA infarct without significant penumbra. 3. Severe proximal right P2 and  P3 stenoses. 4. Widely patent cervical carotid and vertebral arteries. These results were communicated to Dr. Laurence SlateAroor at 9:30 am on 10/05/2019 by text page via the [facility] messaging system. Electronically Signed   By: Sebastian AcheAllen  Grady M.D.   On: 10/05/2019 09:46   CT HEAD WO CONTRAST  Result Date: 10/05/2019 CLINICAL DATA:  Neuro deficit, subacute. EXAM: CT HEAD WITHOUT CONTRAST TECHNIQUE: Contiguous axial images were obtained from the base of the skull through the vertex without intravenous contrast. COMPARISON:  CT angiogram head/neck and CT perfusion 10/05/2019, brain MRI 10/05/2019. FINDINGS: Brain: Again demonstrated is a moderate to large acute cortical/subcortical infarct within the left MCA vascular territory predominantly involving the left parietal and temporal lobes, but also involving portions of the posterior left frontal lobe. The infarct is similar in extent as compared to the MRI performed earlier today. No evidence of hemorrhagic conversion. Mild mass effect with 2 mm rightward midline shift, similar to prior MRI. No hydrocephalus or extra-axial fluid collection. Cerebral volume is normal for age. Vascular: A left M2 proximal branch occlusion was demonstrated on CTA head performed earlier today. No abnormal vascular hyperdensity identified elsewhere. Skull: No calvarial fracture or suspicious osseous lesion. Sinuses/Orbits: A moderate IMPRESSION: A moderate to large left MCA vascular territory acute infarction is similar in extent when comparing with the MRI performed earlier today. No hemorrhagic conversion. Mild mass effect with unchanged 2 mm rightward midline shift. Electronically Signed   By: Jackey LogeKyle  Golden DO   On: 10/05/2019 19:36   CT HEAD WO CONTRAST  Result Date: 10/05/2019 CLINICAL DATA:  Confusion.  Right-sided weakness. EXAM: CT HEAD WITHOUT CONTRAST TECHNIQUE: Contiguous axial images were obtained from the base of the skull through the vertex without intravenous contrast. COMPARISON:   None. FINDINGS: Brain: Sizable area of cytotoxic edema in the superficial left temporal lobe and even more defined in the left frontal parietal region, inferior division MCA territory. No acute hemorrhage. No hydrocephalus or shift. Elsewhere the brain has a normal appearance Vascular: No hyperdense vessel. Skull: Normal Sinuses/Orbits: Normal These results were called by telephone at the time of interpretation on 10/05/2019 at 4:38  am to provider Nita Sickle , who verbally acknowledged these results. IMPRESSION: Acute infarct affecting the superficial left temporal lobe and frontal parietal junction, inferior division MCA territory. No acute hemorrhage. Electronically Signed   By: Marnee Spring M.D.   On: 10/05/2019 04:38   CT ANGIO NECK W OR WO CONTRAST  Result Date: 10/05/2019 CLINICAL DATA:  Acute left MCA infarct. Confusion, expressive aphasia, and right-sided neglect. EXAM: CT ANGIOGRAPHY HEAD AND NECK CT PERFUSION BRAIN TECHNIQUE: Multidetector CT imaging of the head and neck was performed using the standard protocol during bolus administration of intravenous contrast. Multiplanar CT image reconstructions and MIPs were obtained to evaluate the vascular anatomy. Carotid stenosis measurements (when applicable) are obtained utilizing NASCET criteria, using the distal internal carotid diameter as the denominator. Multiphase CT imaging of the brain was performed following IV bolus contrast injection. Subsequent parametric perfusion maps were calculated using RAPID software. CONTRAST:  OMNIPAQUE IOHEXOL 350 MG/ML SOLN COMPARISON:  Head CT and MRI 10/05/2019 FINDINGS: CT HEAD FINDINGS Brain: A moderate-sized acute left MCA territory infarct is again seen involving the left temporal, parietal, and posterior frontal lobes. Cytotoxic edema has mildly increased compared to today's earlier CT, and the distribution of the infarct is unchanged from the MRI. No acute intracranial hemorrhage, midline shift,  or extra-axial fluid collection is identified. The ventricles are normal in size. Vascular: Calcified atherosclerosis at the skull base. Skull: No fracture or suspicious osseous lesion. Sinuses/Orbits: Paranasal sinuses and mastoid air cells are clear. Unremarkable orbits. Other: None. ASPECTS [facility] Stroke Program Early CT Score) - Ganglionic level infarction (caudate, lentiform nuclei, internal capsule, insula, M1-M3 cortex): 5 - Supraganglionic infarction (M4-M6 cortex): 2 Total score (0-10 with 10 being normal): 7 Review of the MIP images confirms the above findings CTA NECK FINDINGS Aortic arch: Standard 3 vessel aortic arch with widely patent arch vessel origins. Right carotid system: Patent with minimal atherosclerotic plaque at the carotid bifurcation. No evidence of stenosis or dissection. Left carotid system: Patent without evidence of stenosis or dissection. Vertebral arteries: Patent and codominant without evidence of stenosis or dissection. Skeleton: Moderately large anterior vertebral osteophytes at C5-6 and C6-7. Other neck: No evidence of cervical lymphadenopathy or mass. Upper chest: Clear lung apices. Review of the MIP images confirms the above findings CTA HEAD FINDINGS Anterior circulation: The internal carotid arteries are patent from skull base to carotid termini with mild atherosclerotic plaque bilaterally not resulting in significant stenosis. The M1 segments are widely patent bilaterally, however there is occlusion of the left M2 inferior division near its origin. ACAs are patent with an azygos A2 configuration and no significant proximal stenosis. No aneurysm is identified. Posterior circulation: The intracranial vertebral arteries are widely patent to the basilar. Patent left PICA, right AICA, and bilateral SCA is are seen. The basilar artery is widely patent. Posterior communicating arteries are diminutive or absent. Both PCAs are patent without evidence of significant stenosis on the  left. There are severe proximal P2 and P3 stenoses on the right. No aneurysm is identified. Venous sinuses: As permitted by contrast timing, patent. Anatomic variants: Azygos A2. Review of the MIP images confirms the above findings CT Brain Perfusion Findings: ASPECTS: 7 CBF (<30%) Volume: 41mL, however a core infarct is evident on CT and MRI Perfusion (Tmax>6.0s) volume: 33mL Mismatch Volume: The region of prolonged Tmax corresponds to the hypodensity on CT and diffusion abnormality on MRI, and no significant penumbra is present Infarction Location: Left MCA inferior division IMPRESSION: 1. Left MCA M2  inferior division occlusion. 2. Associated moderate-sized acute left MCA infarct without significant penumbra. 3. 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 4. Widely patent cervical carotid and vertebral arteries. These results were communicated to Dr. Laurence Slate at 9:30 am on 10/05/2019 by text page via the [facility] messaging system. Electronically Signed   By: Sebastian Ache M.D.   On: 10/05/2019 09:46   MR BRAIN WO CONTRAST  Result Date: 10/05/2019 CLINICAL DATA:  Ataxia with stroke suspected EXAM: MRI HEAD WITHOUT CONTRAST TECHNIQUE: Multiplanar, multiecho pulse sequences of the brain and surrounding structures were obtained without intravenous contrast. COMPARISON:  Head CT from earlier today FINDINGS: Brain: Large area of restricted diffusion in the superficial left temporal cortex and more patchy in the posterior left frontal and parietal cortex. The more inferior infarct has a somewhat more hazy appearance. Based on CT the infarcts may be of slightly different ages, although both acute. No acute hemorrhage, hydrocephalus, or masslike finding. No pre-existing infarct affecting the cortex. Small vessel type changes are minimal and less than expected for age. Brain volume is normal Vascular: Normal proximal flow voids. Skull and upper cervical spine: Normal marrow signal Sinuses/Orbits: Negative IMPRESSION: 1. Large  acute infarct in the inferior division left MCA territory affecting temporal, posterior frontal, and parietal cortex. 2. No hemorrhagic conversion.  No pre-existing infarct. Electronically Signed   By: Marnee Spring M.D.   On: 10/05/2019 06:49   CT CEREBRAL PERFUSION W CONTRAST  Result Date: 10/05/2019 CLINICAL DATA:  Acute left MCA infarct. Confusion, expressive aphasia, and right-sided neglect. EXAM: CT ANGIOGRAPHY HEAD AND NECK CT PERFUSION BRAIN TECHNIQUE: Multidetector CT imaging of the head and neck was performed using the standard protocol during bolus administration of intravenous contrast. Multiplanar CT image reconstructions and MIPs were obtained to evaluate the vascular anatomy. Carotid stenosis measurements (when applicable) are obtained utilizing NASCET criteria, using the distal internal carotid diameter as the denominator. Multiphase CT imaging of the brain was performed following IV bolus contrast injection. Subsequent parametric perfusion maps were calculated using RAPID software. CONTRAST:  OMNIPAQUE IOHEXOL 350 MG/ML SOLN COMPARISON:  Head CT and MRI 10/05/2019 FINDINGS: CT HEAD FINDINGS Brain: A moderate-sized acute left MCA territory infarct is again seen involving the left temporal, parietal, and posterior frontal lobes. Cytotoxic edema has mildly increased compared to today's earlier CT, and the distribution of the infarct is unchanged from the MRI. No acute intracranial hemorrhage, midline shift, or extra-axial fluid collection is identified. The ventricles are normal in size. Vascular: Calcified atherosclerosis at the skull base. Skull: No fracture or suspicious osseous lesion. Sinuses/Orbits: Paranasal sinuses and mastoid air cells are clear. Unremarkable orbits. Other: None. ASPECTS [facility] Stroke Program Early CT Score) - Ganglionic level infarction (caudate, lentiform nuclei, internal capsule, insula, M1-M3 cortex): 5 - Supraganglionic infarction (M4-M6 cortex): 2 Total  score (0-10 with 10 being normal): 7 Review of the MIP images confirms the above findings CTA NECK FINDINGS Aortic arch: Standard 3 vessel aortic arch with widely patent arch vessel origins. Right carotid system: Patent with minimal atherosclerotic plaque at the carotid bifurcation. No evidence of stenosis or dissection. Left carotid system: Patent without evidence of stenosis or dissection. Vertebral arteries: Patent and codominant without evidence of stenosis or dissection. Skeleton: Moderately large anterior vertebral osteophytes at C5-6 and C6-7. Other neck: No evidence of cervical lymphadenopathy or mass. Upper chest: Clear lung apices. Review of the MIP images confirms the above findings CTA HEAD FINDINGS Anterior circulation: The internal carotid arteries are patent from skull  base to carotid termini with mild atherosclerotic plaque bilaterally not resulting in significant stenosis. The M1 segments are widely patent bilaterally, however there is occlusion of the left M2 inferior division near its origin. ACAs are patent with an azygos A2 configuration and no significant proximal stenosis. No aneurysm is identified. Posterior circulation: The intracranial vertebral arteries are widely patent to the basilar. Patent left PICA, right AICA, and bilateral SCA is are seen. The basilar artery is widely patent. Posterior communicating arteries are diminutive or absent. Both PCAs are patent without evidence of significant stenosis on the left. There are severe proximal P2 and P3 stenoses on the right. No aneurysm is identified. Venous sinuses: As permitted by contrast timing, patent. Anatomic variants: Azygos A2. Review of the MIP images confirms the above findings CT Brain Perfusion Findings: ASPECTS: 7 CBF (<30%) Volume: 0mL, however a core infarct is evident on CT and MRI Perfusion (Tmax>6.0s) volume: 29mL Mismatch Volume: The region of prolonged Tmax corresponds to the hypodensity on CT and diffusion abnormality on  MRI, and no significant penumbra is present Infarction Location: Left MCA inferior division IMPRESSION: 1. Left MCA M2 inferior division occlusion. 2. Associated moderate-sized acute left MCA infarct without significant penumbra. 3. 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 4. Widely patent cervical carotid and vertebral arteries. These results were communicated to Dr. Laurence Slate at 9:30 am on 10/05/2019 by text page via the [facility] messaging system. Electronically Signed   By: Sebastian Ache M.D.   On: 10/05/2019 09:46   ECHO TEE  Result Date: 10/06/2019    TRANSESOPHOGEAL ECHO REPORT   Patient Name:   Robin Mosley Date of Exam: 10/06/2019 Medical Rec #:  [id number]      Height:       62.0 in Accession #:    [id number]     Weight:       138.7 lb Date of Birth:  Dec 19, 1955      BSA:          1.636 m Patient Age:    64 years       BP:           131/66 mmHg Patient Gender: F              HR:           67 bpm. Exam Location:  [facility] Procedure: Transesophageal Echo, Color Doppler, Cardiac Doppler and Saline            Contrast Bubble Study Indications:     Not listed on front sheet of order.  History:         Patient has prior history of Echocardiogram examinations, most                  recent 10/05/2019. No cardiac history listed.  Sonographer:     Cristela Blue RDCS (AE) Referring Phys:  [id number] Antonieta Iba Diagnosing Phys: Julien Nordmann MD PROCEDURE: After discussion of the risks and benefits of a TEE, an informed consent was obtained from the patient. TEE procedure time was 40 minutes. The transesophogeal probe was passed without difficulty through the esophogus of the patient. Imaged were obtained with the patient in a left lateral decubitus position. Local oropharyngeal anesthetic was provided with Cetacaine and viscous lidocaine. Sedation performed by performing physician. Patients was under conscious sedation during this procedure. Anesthetic administered: of Fentanyl, 5.0mg  of Versed. Image quality was  excellent. The patient's vital signs; including heart rate, blood pressure, and oxygen saturation;  remained stable throughout the procedure. The patient developed no complications during the procedure. IMPRESSIONS  1. Left ventricular ejection fraction, by estimation, is 60 to 65%. The left ventricle has normal function. The left ventricle has no regional wall motion abnormalities. Left ventricular diastolic parameters are indeterminate.  2. Right ventricular systolic function is normal. The right ventricular size is normal.  3. No left atrial/left atrial appendage thrombus was detected.  4. The mitral valve is normal in structure and function. No evidence of mitral valve regurgitation. No evidence of mitral stenosis.  5. The aortic valve is normal in structure and function. Aortic valve regurgitation is trivial. No aortic stenosis is present.  6. There is mild (Grade II) plaque.  7. The inferior vena cava is normal in size with greater than 50% respiratory variability, suggesting right atrial pressure of 3 mmHg. Conclusion(s)/Recommendation(s): Normal biventricular function without evidence of hemodynamically significant valvular heart disease. FINDINGS  Left Ventricle: Left ventricular ejection fraction, by estimation, is 60 to 65%. The left ventricle has normal function. The left ventricle has no regional wall motion abnormalities. The left ventricular internal cavity size was normal in size. There is  no left ventricular hypertrophy. Right Ventricle: The right ventricular size is normal. No increase in right ventricular wall thickness. Right ventricular systolic function is normal. Left Atrium: Left atrial size was normal in size. No left atrial/left atrial appendage thrombus was detected. Right Atrium: Right atrial size was normal in size. Pericardium: There is no evidence of pericardial effusion. Mitral Valve: The mitral valve is normal in structure and function. Normal mobility of the mitral valve leaflets.  No evidence of mitral valve regurgitation. No evidence of mitral valve stenosis. Tricuspid Valve: The tricuspid valve is normal in structure. Tricuspid valve regurgitation is mild . No evidence of tricuspid stenosis. Aortic Valve: The aortic valve is normal in structure and function. Aortic valve regurgitation is trivial. No aortic stenosis is present. Pulmonic Valve: The pulmonic valve was normal in structure. Pulmonic valve regurgitation is not visualized. No evidence of pulmonic stenosis. Aorta: The aortic root is normal in size and structure. There is mild (Grade II) plaque. Venous: The inferior vena cava is normal in size with greater than 50% respiratory variability, suggesting right atrial pressure of 3 mmHg. IAS/Shunts: No atrial level shunt detected by color flow Doppler. Agitated saline contrast was given intravenously to evaluate for intracardiac shunting. There is no evidence of a patent foramen ovale. There is no evidence of an atrial septal defect. Julien Nordmann MD Electronically signed by Julien Nordmann MD Signature Date/Time: 10/06/2019/1:14:03 PM    Final    ECHOCARDIOGRAM COMPLETE BUBBLE STUDY  Result Date: 10/05/2019    ECHOCARDIOGRAM REPORT   Patient Name:   Robin Mosley Date of Exam: 10/05/2019 Medical Rec #:  [id number]      Height:       62.0 in Accession #:    [id number]     Weight:       138.7 lb Date of Birth:  22-Aug-1955      BSA:          1.636 m Patient Age:    64 years       BP:           150/61 mmHg Patient Gender: F              HR:           75 bpm. Exam Location:  [facility] Procedure: 2D Echo, Color Doppler, Cardiac Doppler and  Saline Contrast Bubble            Study Indications:     I163.9 Stroke  History:         Patient has no prior history of Echocardiogram examinations.                  Signs/Symptoms:Confusion. No medical history.  Sonographer:     Humphrey Rolls RDCS (AE) Referring Phys:  [id number] Earl Lites DANFORD Diagnosing Phys: Adrian Blackwater MD IMPRESSIONS  1. Left  ventricular ejection fraction, by estimation, is 40 to 45%. The left ventricle has mildly decreased function. The left ventricle demonstrates regional wall motion abnormalities (see scoring diagram/findings for description). The left ventricular  internal cavity size was mildly dilated. Left ventricular diastolic parameters are consistent with Grade I diastolic dysfunction (impaired relaxation).  2. Right ventricular systolic function is normal. The right ventricular size is normal.  3. The mitral valve is normal in structure and function. Trivial mitral valve regurgitation. No evidence of mitral stenosis.  4. The aortic valve is normal in structure and function. Aortic valve regurgitation is not visualized. No aortic stenosis is present.  5. The inferior vena cava is normal in size with greater than 50% respiratory variability, suggesting right atrial pressure of 3 mmHg. FINDINGS  Left Ventricle: Left ventricular ejection fraction, by estimation, is 40 to 45%. The left ventricle has mildly decreased function. The left ventricle demonstrates regional wall motion abnormalities. The left ventricular internal cavity size was mildly dilated. There is no left ventricular hypertrophy. Left ventricular diastolic parameters are consistent with Grade I diastolic dysfunction (impaired relaxation).  LV Wall Scoring: The mid anteroseptal segment and mid inferoseptal segment are normal. Right Ventricle: The right ventricular size is normal. No increase in right ventricular wall thickness. Right ventricular systolic function is normal. Left Atrium: Left atrial size was normal in size. Right Atrium: Right atrial size was normal in size. Pericardium: There is no evidence of pericardial effusion. Mitral Valve: The mitral valve is normal in structure and function. Normal mobility of the mitral valve leaflets. Trivial mitral valve regurgitation. No evidence of mitral valve stenosis. MV peak gradient, 2.6 mmHg. The mean mitral valve  gradient is 1.0 mmHg. Tricuspid Valve: The tricuspid valve is normal in structure. Tricuspid valve regurgitation is trivial. No evidence of tricuspid stenosis. Aortic Valve: The aortic valve is normal in structure and function. Aortic valve regurgitation is not visualized. No aortic stenosis is present. Aortic valve mean gradient measures 3.0 mmHg. Aortic valve peak gradient measures 7.2 mmHg. Aortic valve area, by VTI measures 2.07 cm. Pulmonic Valve: The pulmonic valve was normal in structure. Pulmonic valve regurgitation is not visualized. No evidence of pulmonic stenosis. Aorta: The aortic root is normal in size and structure. Venous: The inferior vena cava is normal in size with greater than 50% respiratory variability, suggesting right atrial pressure of 3 mmHg. IAS/Shunts: No atrial level shunt detected by color flow Doppler. Agitated saline contrast was given intravenously to evaluate for intracardiac shunting.  LEFT VENTRICLE PLAX 2D LVIDd:         3.99 cm  Diastology LVIDs:         3.22 cm  LV e' lateral:   9.36 cm/s LV PW:         1.00 cm  LV E/e' lateral: 6.4 LV IVS:        0.86 cm  LV e' medial:    7.29 cm/s LVOT diam:     1.90 cm  LV E/e'  medial:  8.2 LV SV:         55 LV SV Index:   34 LVOT Area:     2.84 cm  RIGHT VENTRICLE RV Basal diam:  2.76 cm LEFT ATRIUM             Index       RIGHT ATRIUM          Index LA diam:        3.40 cm 2.08 cm/m  RA Area:     9.13 cm LA Vol (A2C):   22.7 ml 13.87 ml/m RA Volume:   17.00 ml 10.39 ml/m LA Vol (A4C):   55.1 ml 33.68 ml/m LA Biplane Vol: 38.4 ml 23.47 ml/m  AORTIC VALVE                   PULMONIC VALVE AV Area (Vmax):    2.12 cm    PV Vmax:       1.05 m/s AV Area (Vmean):   2.10 cm    PV Vmean:      66.900 cm/s AV Area (VTI):     2.07 cm    PV VTI:        0.219 m AV Vmax:           134.00 cm/s PV Peak grad:  4.4 mmHg AV Vmean:          87.200 cm/s PV Mean grad:  2.0 mmHg AV VTI:            0.267 m AV Peak Grad:      7.2 mmHg AV Mean Grad:       3.0 mmHg LVOT Vmax:         100.00 cm/s LVOT Vmean:        64.700 cm/s LVOT VTI:          0.195 m LVOT/AV VTI ratio: 0.73  AORTA Ao Root diam: 2.60 cm MITRAL VALVE MV Area (PHT): 3.60 cm    SHUNTS MV Peak grad:  2.6 mmHg    Systemic VTI:  0.20 m MV Mean grad:  1.0 mmHg    Systemic Diam: 1.90 cm MV Vmax:       0.81 m/s MV Vmean:      57.1 cm/s MV Decel Time: 211 msec MV E velocity: 60.10 cm/s MV A velocity: 80.00 cm/s MV E/A ratio:  0.75 Neoma Laming MD Electronically signed by Neoma Laming MD Signature Date/Time: 10/05/2019/2:56:18 PM    Final      Assessment and Plan:   1. 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  Will defer timing of procedure to Dr.S.  Caryl Comes  2.  Marijuana Positive on arrival  3.  Cardiomyopathy Initial ejection fraction transthoracic estimated 40-45% read by outside physician -On personal review of these images, ejection fraction has been under reported and appears to be closer to 60% This is consistent with ejection fraction noted on transesophageal echo -No ischemic work-up needed at this time    Total encounter time more than  110 minutes  Greater than 50% was spent in counseling and coordination of care with the patient   [facility] will sign off.   Medication Recommendations: No further changes Other recommendations (labs, testing, etc): No further testing Arrangements being made for loop monitor as outpatient Follow up as an outpatient: with Dr. Graciela HusbandsKlein for loop  monitor  For questions or updates, please contact [facility] Please consult [email or web address] for contact info under     Signed, Julien Nordmannimothy Marybella Ethier, MD  10/06/2019 2:37 PM

## 2019-10-06 NOTE — Progress Notes (Signed)
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 PT reports patient continues to present with expressive language deficits. Yesterday, SLP met w/ pt and discussed the role of Speech Therapy; probable f/u at Discharge w/ ST services. Briefly discussed strategy of slowing down when trying to say something; relaxing. Patient has orders for speech therapy services at discharge.  Will evaluate language tomorrow if patient is still in the hospital.  Leroy Sea, MS/CCC- SLP

## 2019-10-06 NOTE — Progress Notes (Signed)
PROGRESS NOTE    Robin Mosley  WCB:[id number] DOB: Dec 01, 1955 DOA: 10/05/2019 PCP: Patient, No Pcp Per      Brief Narrative:  Robin Mosley is a 64 y.o. F with hx IBS, smoking who presented with acute aphasia.  Patient LSN at 9PM evening prior to presentation.  At 3AM, family found her wandering in house, aphasic.  In the ER, CT head showed acute left MCA infarct.  CODE STROKE not called, Neurology not consulted.  EtOH negative, cannabis UDS positive.  Patient given aspirin and admitted to hospitalist service.      Assessment & Plan:  Acute stroke CT head showed acute stroke.  MRI brain showed  "Large acute infarct in the inferior division left MCA territory affecting temporal, posterior frontal, and parietal cortex". -Non-invasive angiography showed no significant carotid disease -TEE performed today, showed no atrial clot, no PFO, normal E -Lipids ordered: LDL 150, started Crestor -Aspirin ordered at admission --> continue aspirin 325 -Atrial fibrillation: not present on tele, ILR arranged outpatient -tPA not given because outside window -Dysphagia screen ordered in ER, passed -PT eval ordered -Smoking cessation: recommended    Hypokalemia Mild      Disposition: The patient was admitted with new acute stroke.  She has severe ongoing deficits in expressive aphasia, and right-sided hemiparesis.  She will need extensive rehabilitation return to her prior independent level of function.     Neurology recommend observation for 24 hours given degree of swelling, possilby home tomorrow.        MDM: The below labs and imaging reports were reviewed and summarized above.  Medication management as above.     DVT prophylaxis: Lovenox Code Status: Full code Family Communication: Brother at bedside, daughter attempted to reach twice by phone, no answer    Consultants:   Neuro  Procedures:   TTE  MRI  CTA head and neck  Antimicrobials:      Culture  data:              Subjective: Patient is feeling well, her appetite is good.  No confusion.  She has right-sided weakness, and persistent expressive aphasia.  Objective: Vitals:   10/06/19 1230 10/06/19 1245 10/06/19 1314 10/06/19 1553  BP: 131/66 (!) 147/81 134/67 133/64  Pulse: 67 67 70 76  Resp: 15 15 17 17   Temp:   98.3 F (36.8 C) 98.3 F (36.8 C)  TempSrc:   Oral Oral  SpO2: 93% 94% 98% 97%  Weight:      Height:        Intake/Output Summary (Last 24 hours) at 10/06/2019 1757 Last data filed at 10/05/2019 2310 Gross per 24 hour  Intake --  Output 0 ml  Net 0 ml   Filed Weights   10/05/19 0500  Weight: 62.9 kg    Examination: General appearance: well Nourished adult female, alert and in no  acute distress.   HEENT: Anicteric, conjunctiva pink, lids and lashes normal. No nasal deformity, discharge, epistaxis.  Lips moist.   Skin: Warm and dry.  No jaundice.  No suspicious rashes or lesions. Cardiac: RRR, nl S1-S2, no murmurs appreciated.  Capillary refill is brisk.  JVP normal. No LE edema.  Radial  pulses 2+ and symmetric. Respiratory: Normal respiratory rate and rhythm.  CTAB without rales or wheezes. Abdomen: Abdomen soft.  No TTP guarding. No ascites, distension, hepatosplenomegaly.   MSK: No deformities or effusions. Neuro: Awake and alert.  EOMI, mild right sided weakness, severe desnse expressive aphasia.  Halting  speech. Psych: Sensorium intact and responding to questions, attention diminished. Affect blunted.  Judgment and insight appear impaired.    Data Reviewed: I have personally reviewed following labs and imaging studies:  CBC: Recent Labs  Lab 10/05/19 0419  WBC 7.8  NEUTROABS 5.3  HGB 13.4  HCT 40.3  MCV 85.0  PLT 292   Basic Metabolic Panel: Recent Labs  Lab 10/05/19 0419 10/06/19 0525  NA 142 141  K 3.3* 4.2  CL 105 107  CO2 28 28  GLUCOSE 100* 94  BUN 11 8  CREATININE 0.67 0.66  CALCIUM 8.7* 8.7*  MG 2.0  --     GFR: Estimated Creatinine Clearance: 62.7 mL/min (by C-G formula based on SCr of 0.66 mg/dL). Liver Function Tests: Recent Labs  Lab 10/05/19 0419  AST 16  ALT 13  ALKPHOS 81  BILITOT 0.4  PROT 7.5  ALBUMIN 3.9   No results for input(s): LIPASE, AMYLASE in the last 168 hours. No results for input(s): AMMONIA in the last 168 hours. Coagulation Profile: Recent Labs  Lab 10/05/19 0419  INR 0.9   Cardiac Enzymes: No results for input(s): CKTOTAL, CKMB, CKMBINDEX, TROPONINI in the last 168 hours. BNP (last 3 results) No results for input(s): PROBNP in the last 8760 hours. HbA1C: Recent Labs    10/05/19 0419  HGBA1C 5.5   CBG: No results for input(s): GLUCAP in the last 168 hours. Lipid Profile: Recent Labs    10/05/19 0419  CHOL 224*  HDL 52  LDLCALC 150*  TRIG 112  CHOLHDL 4.3   Thyroid Function Tests: No results for input(s): TSH, T4TOTAL, FREET4, T3FREE, THYROIDAB in the last 72 hours. Anemia Panel: No results for input(s): VITAMINB12, FOLATE, FERRITIN, TIBC, IRON, RETICCTPCT in the last 72 hours. Urine analysis:    Component Value Date/Time   COLORURINE YELLOW (A) 10/05/2019 0419   APPEARANCEUR HAZY (A) 10/05/2019 0419   LABSPEC 1.019 10/05/2019 0419   PHURINE 5.0 10/05/2019 0419   GLUCOSEU NEGATIVE 10/05/2019 0419   HGBUR MODERATE (A) 10/05/2019 0419   BILIRUBINUR NEGATIVE 10/05/2019 0419   KETONESUR NEGATIVE 10/05/2019 0419   PROTEINUR NEGATIVE 10/05/2019 0419   NITRITE NEGATIVE 10/05/2019 0419   LEUKOCYTESUR NEGATIVE 10/05/2019 0419   Sepsis Labs: @LABRCNTIP (procalcitonin:4,lacticacidven:4)  ) Recent Results (from the past 240 hour(s))  Respiratory Panel by RT PCR (Flu A&B, Covid) - Nasopharyngeal Swab     Status: None   Collection Time: 10/05/19  4:19 AM   Specimen: Nasopharyngeal Swab  Result Value Ref Range Status   SARS Coronavirus 2 by RT PCR NEGATIVE NEGATIVE Final    Comment: (NOTE) SARS-CoV-2 target nucleic acids are NOT  DETECTED. The SARS-CoV-2 RNA is generally detectable in upper respiratoy specimens during the acute phase of infection. The lowest concentration of SARS-CoV-2 viral copies this assay can detect is 131 copies/mL. A negative result does not preclude SARS-Cov-2 infection and should not be used as the sole basis for treatment or other patient management decisions. A negative result may occur with  improper specimen collection/handling, submission of specimen other than nasopharyngeal swab, presence of viral mutation(s) within the areas targeted by this assay, and inadequate number of viral copies (<131 copies/mL). A negative result must be combined with clinical observations, patient history, and epidemiological information. The expected result is Negative. Fact Sheet for Patients:  10/07/19 Fact Sheet for Healthcare Providers:  https://www.moore.com/ This test is not yet ap proved or cleared by the https://www.young.biz/ FDA and  has been authorized for detection and/or diagnosis  of SARS-CoV-2 by FDA under an Emergency Use Authorization (EUA). This EUA will remain  in effect (meaning this test can be used) for the duration of the COVID-19 declaration under Section 564(b)(1) of the Act, 21 U.S.C. section 360bbb-3(b)(1), unless the authorization is terminated or revoked sooner.    Influenza A by PCR NEGATIVE NEGATIVE Final   Influenza B by PCR NEGATIVE NEGATIVE Final    Comment: (NOTE) The Xpert Xpress SARS-CoV-2/FLU/RSV assay is intended as an aid in  the diagnosis of influenza from Nasopharyngeal swab specimens and  should not be used as a sole basis for treatment. Nasal washings and  aspirates are unacceptable for Xpert Xpress SARS-CoV-2/FLU/RSV  testing. Fact Sheet for Patients: https://www.moore.com/ Fact Sheet for Healthcare Providers: https://www.young.biz/ This test is not yet approved or cleared  by the [place] FDA and  has been authorized for detection and/or diagnosis of SARS-CoV-2 by  FDA under an Emergency Use Authorization (EUA). This EUA will remain  in effect (meaning this test can be used) for the duration of the  Covid-19 declaration under Section 564(b)(1) of the Act, 21  U.S.C. section 360bbb-3(b)(1), unless the authorization is  terminated or revoked. Performed at [facility], [address] [place], [place] [address]   CULTURE, BLOOD (ROUTINE X 2) w Reflex to ID Panel     Status: None (Preliminary result)   Collection Time: 10/05/19  4:24 PM   Specimen: BLOOD  Result Value Ref Range Status   Specimen Description BLOOD RIGHT ANTECUBITAL  Final   Special Requests   Final    BOTTLES DRAWN AEROBIC AND ANAEROBIC Blood Culture adequate volume   Culture   Final    NO GROWTH < 24 HOURS Performed at [facility], [address] [place], [place] [address]    Report Status PENDING  Incomplete  CULTURE, BLOOD (ROUTINE X 2) w Reflex to ID Panel     Status: None (Preliminary result)   Collection Time: 10/05/19  4:35 PM   Specimen: BLOOD  Result Value Ref Range Status   Specimen Description BLOOD BLOOD LEFT HAND  Final   Special Requests   Final    BOTTLES DRAWN AEROBIC ONLY Blood Culture adequate volume   Culture   Final    NO GROWTH < 24 HOURS Performed at [facility], [address], [place], [place] [address]    Report Status PENDING  Incomplete         Radiology Studies: CT ANGIO HEAD W OR WO CONTRAST  Result Date: 10/05/2019 CLINICAL DATA:  Acute left MCA infarct. Confusion, expressive aphasia, and right-sided neglect. EXAM: CT ANGIOGRAPHY HEAD AND NECK CT PERFUSION BRAIN TECHNIQUE: Multidetector CT imaging of the head and neck was performed using the standard protocol during bolus administration of intravenous contrast. Multiplanar CT image reconstructions and MIPs were obtained to evaluate the vascular anatomy. Carotid  stenosis measurements (when applicable) are obtained utilizing NASCET criteria, using the distal internal carotid diameter as the denominator. Multiphase CT imaging of the brain was performed following IV bolus contrast injection. Subsequent parametric perfusion maps were calculated using RAPID software. CONTRAST:  OMNIPAQUE IOHEXOL 350 MG/ML SOLN COMPARISON:  Head CT and MRI 10/05/2019 FINDINGS: CT HEAD FINDINGS Brain: A moderate-sized acute left MCA territory infarct is again seen involving the left temporal, parietal, and posterior frontal lobes. Cytotoxic edema has mildly increased compared to today's earlier CT, and the distribution of the infarct is unchanged from the MRI. No acute intracranial hemorrhage, midline shift, or extra-axial  fluid collection is identified. The ventricles are normal in size. Vascular: Calcified atherosclerosis at the skull base. Skull: No fracture or suspicious osseous lesion. Sinuses/Orbits: Paranasal sinuses and mastoid air cells are clear. Unremarkable orbits. Other: None. ASPECTS [facility] Stroke Program Early CT Score) - Ganglionic level infarction (caudate, lentiform nuclei, internal capsule, insula, M1-M3 cortex): 5 - Supraganglionic infarction (M4-M6 cortex): 2 Total score (0-10 with 10 being normal): 7 Review of the MIP images confirms the above findings CTA NECK FINDINGS Aortic arch: Standard 3 vessel aortic arch with widely patent arch vessel origins. Right carotid system: Patent with minimal atherosclerotic plaque at the carotid bifurcation. No evidence of stenosis or dissection. Left carotid system: Patent without evidence of stenosis or dissection. Vertebral arteries: Patent and codominant without evidence of stenosis or dissection. Skeleton: Moderately large anterior vertebral osteophytes at C5-6 and C6-7. Other neck: No evidence of cervical lymphadenopathy or mass. Upper chest: Clear lung apices. Review of the MIP images confirms the above findings CTA HEAD  FINDINGS Anterior circulation: The internal carotid arteries are patent from skull base to carotid termini with mild atherosclerotic plaque bilaterally not resulting in significant stenosis. The M1 segments are widely patent bilaterally, however there is occlusion of the left M2 inferior division near its origin. ACAs are patent with an azygos A2 configuration and no significant proximal stenosis. No aneurysm is identified. Posterior circulation: The intracranial vertebral arteries are widely patent to the basilar. Patent left PICA, right AICA, and bilateral SCA is are seen. The basilar artery is widely patent. Posterior communicating arteries are diminutive or absent. Both PCAs are patent without evidence of significant stenosis on the left. There are severe proximal P2 and P3 stenoses on the right. No aneurysm is identified. Venous sinuses: As permitted by contrast timing, patent. Anatomic variants: Azygos A2. Review of the MIP images confirms the above findings CT Brain Perfusion Findings: ASPECTS: 7 CBF (<30%) Volume: 0mL, however a core infarct is evident on CT and MRI Perfusion (Tmax>6.0s) volume: 29mL Mismatch Volume: The region of prolonged Tmax corresponds to the hypodensity on CT and diffusion abnormality on MRI, and no significant penumbra is present Infarction Location: Left MCA inferior division IMPRESSION: 1. Left MCA M2 inferior division occlusion. 2. Associated moderate-sized acute left MCA infarct without significant penumbra. 3. 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 4. Widely patent cervical carotid and vertebral arteries. These results were communicated to Dr. Laurence Slate at 9:30 am on 10/05/2019 by text page via the [facility] messaging system. Electronically Signed   By: Sebastian Ache M.D.   On: 10/05/2019 09:46   CT HEAD WO CONTRAST  Result Date: 10/05/2019 CLINICAL DATA:  Neuro deficit, subacute. EXAM: CT HEAD WITHOUT CONTRAST TECHNIQUE: Contiguous axial images were obtained from the base of the  skull through the vertex without intravenous contrast. COMPARISON:  CT angiogram head/neck and CT perfusion 10/05/2019, brain MRI 10/05/2019. FINDINGS: Brain: Again demonstrated is a moderate to large acute cortical/subcortical infarct within the left MCA vascular territory predominantly involving the left parietal and temporal lobes, but also involving portions of the posterior left frontal lobe. The infarct is similar in extent as compared to the MRI performed earlier today. No evidence of hemorrhagic conversion. Mild mass effect with 2 mm rightward midline shift, similar to prior MRI. No hydrocephalus or extra-axial fluid collection. Cerebral volume is normal for age. Vascular: A left M2 proximal branch occlusion was demonstrated on CTA head performed earlier today. No abnormal vascular hyperdensity identified elsewhere. Skull: No calvarial fracture or suspicious osseous lesion.  Sinuses/Orbits: A moderate IMPRESSION: A moderate to large left MCA vascular territory acute infarction is similar in extent when comparing with the MRI performed earlier today. No hemorrhagic conversion. Mild mass effect with unchanged 2 mm rightward midline shift. Electronically Signed   By: Jackey Loge DO   On: 10/05/2019 19:36   CT HEAD WO CONTRAST  Result Date: 10/05/2019 CLINICAL DATA:  Confusion.  Right-sided weakness. EXAM: CT HEAD WITHOUT CONTRAST TECHNIQUE: Contiguous axial images were obtained from the base of the skull through the vertex without intravenous contrast. COMPARISON:  None. FINDINGS: Brain: Sizable area of cytotoxic edema in the superficial left temporal lobe and even more defined in the left frontal parietal region, inferior division MCA territory. No acute hemorrhage. No hydrocephalus or shift. Elsewhere the brain has a normal appearance Vascular: No hyperdense vessel. Skull: Normal Sinuses/Orbits: Normal These results were called by telephone at the time of interpretation on 10/05/2019 at 4:38 am to provider  [facility] [place] , who verbally acknowledged these results. IMPRESSION: Acute infarct affecting the superficial left temporal lobe and frontal parietal junction, inferior division MCA territory. No acute hemorrhage. Electronically Signed   By: Marnee Spring M.D.   On: 10/05/2019 04:38   CT ANGIO NECK W OR WO CONTRAST  Result Date: 10/05/2019 CLINICAL DATA:  Acute left MCA infarct. Confusion, expressive aphasia, and right-sided neglect. EXAM: CT ANGIOGRAPHY HEAD AND NECK CT PERFUSION BRAIN TECHNIQUE: Multidetector CT imaging of the head and neck was performed using the standard protocol during bolus administration of intravenous contrast. Multiplanar CT image reconstructions and MIPs were obtained to evaluate the vascular anatomy. Carotid stenosis measurements (when applicable) are obtained utilizing NASCET criteria, using the distal internal carotid diameter as the denominator. Multiphase CT imaging of the brain was performed following IV bolus contrast injection. Subsequent parametric perfusion maps were calculated using RAPID software. CONTRAST:  OMNIPAQUE IOHEXOL 350 MG/ML SOLN COMPARISON:  Head CT and MRI 10/05/2019 FINDINGS: CT HEAD FINDINGS Brain: A moderate-sized acute left MCA territory infarct is again seen involving the left temporal, parietal, and posterior frontal lobes. Cytotoxic edema has mildly increased compared to today's earlier CT, and the distribution of the infarct is unchanged from the MRI. No acute intracranial hemorrhage, midline shift, or extra-axial fluid collection is identified. The ventricles are normal in size. Vascular: Calcified atherosclerosis at the skull base. Skull: No fracture or suspicious osseous lesion. Sinuses/Orbits: Paranasal sinuses and mastoid air cells are clear. Unremarkable orbits. Other: None. ASPECTS [facility] Stroke Program Early CT Score) - Ganglionic level infarction (caudate, lentiform nuclei, internal capsule, insula, M1-M3 cortex): 5 -  Supraganglionic infarction (M4-M6 cortex): 2 Total score (0-10 with 10 being normal): 7 Review of the MIP images confirms the above findings CTA NECK FINDINGS Aortic arch: Standard 3 vessel aortic arch with widely patent arch vessel origins. Right carotid system: Patent with minimal atherosclerotic plaque at the carotid bifurcation. No evidence of stenosis or dissection. Left carotid system: Patent without evidence of stenosis or dissection. Vertebral arteries: Patent and codominant without evidence of stenosis or dissection. Skeleton: Moderately large anterior vertebral osteophytes at C5-6 and C6-7. Other neck: No evidence of cervical lymphadenopathy or mass. Upper chest: Clear lung apices. Review of the MIP images confirms the above findings CTA HEAD FINDINGS Anterior circulation: The internal carotid arteries are patent from skull base to carotid termini with mild atherosclerotic plaque bilaterally not resulting in significant stenosis. The M1 segments are widely patent bilaterally, however there is occlusion of the left M2 inferior division near its origin.  ACAs are patent with an azygos A2 configuration and no significant proximal stenosis. No aneurysm is identified. Posterior circulation: The intracranial vertebral arteries are widely patent to the basilar. Patent left PICA, right AICA, and bilateral SCA is are seen. The basilar artery is widely patent. Posterior communicating arteries are diminutive or absent. Both PCAs are patent without evidence of significant stenosis on the left. There are severe proximal P2 and P3 stenoses on the right. No aneurysm is identified. Venous sinuses: As permitted by contrast timing, patent. Anatomic variants: Azygos A2. Review of the MIP images confirms the above findings CT Brain Perfusion Findings: ASPECTS: 7 CBF (<30%) Volume: 0mL, however a core infarct is evident on CT and MRI Perfusion (Tmax>6.0s) volume: 29mL Mismatch Volume: The region of prolonged Tmax corresponds to  the hypodensity on CT and diffusion abnormality on MRI, and no significant penumbra is present Infarction Location: Left MCA inferior division IMPRESSION: 1. Left MCA M2 inferior division occlusion. 2. Associated moderate-sized acute left MCA infarct without significant penumbra. 3. 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 4. Widely patent cervical carotid and vertebral arteries. These results were communicated to Dr. Laurence Slate at 9:30 am on 10/05/2019 by text page via the [facility] messaging system. Electronically Signed   By: Sebastian Ache M.D.   On: 10/05/2019 09:46   MR BRAIN WO CONTRAST  Result Date: 10/05/2019 CLINICAL DATA:  Ataxia with stroke suspected EXAM: MRI HEAD WITHOUT CONTRAST TECHNIQUE: Multiplanar, multiecho pulse sequences of the brain and surrounding structures were obtained without intravenous contrast. COMPARISON:  Head CT from earlier today FINDINGS: Brain: Large area of restricted diffusion in the superficial left temporal cortex and more patchy in the posterior left frontal and parietal cortex. The more inferior infarct has a somewhat more hazy appearance. Based on CT the infarcts may be of slightly different ages, although both acute. No acute hemorrhage, hydrocephalus, or masslike finding. No pre-existing infarct affecting the cortex. Small vessel type changes are minimal and less than expected for age. Brain volume is normal Vascular: Normal proximal flow voids. Skull and upper cervical spine: Normal marrow signal Sinuses/Orbits: Negative IMPRESSION: 1. Large acute infarct in the inferior division left MCA territory affecting temporal, posterior frontal, and parietal cortex. 2. No hemorrhagic conversion.  No pre-existing infarct. Electronically Signed   By: Marnee Spring M.D.   On: 10/05/2019 06:49   CT CEREBRAL PERFUSION W CONTRAST  Result Date: 10/05/2019 CLINICAL DATA:  Acute left MCA infarct. Confusion, expressive aphasia, and right-sided neglect. EXAM: CT ANGIOGRAPHY HEAD AND NECK CT  PERFUSION BRAIN TECHNIQUE: Multidetector CT imaging of the head and neck was performed using the standard protocol during bolus administration of intravenous contrast. Multiplanar CT image reconstructions and MIPs were obtained to evaluate the vascular anatomy. Carotid stenosis measurements (when applicable) are obtained utilizing NASCET criteria, using the distal internal carotid diameter as the denominator. Multiphase CT imaging of the brain was performed following IV bolus contrast injection. Subsequent parametric perfusion maps were calculated using RAPID software. CONTRAST:  OMNIPAQUE IOHEXOL 350 MG/ML SOLN COMPARISON:  Head CT and MRI 10/05/2019 FINDINGS: CT HEAD FINDINGS Brain: A moderate-sized acute left MCA territory infarct is again seen involving the left temporal, parietal, and posterior frontal lobes. Cytotoxic edema has mildly increased compared to today's earlier CT, and the distribution of the infarct is unchanged from the MRI. No acute intracranial hemorrhage, midline shift, or extra-axial fluid collection is identified. The ventricles are normal in size. Vascular: Calcified atherosclerosis at the skull base. Skull: No fracture or  suspicious osseous lesion. Sinuses/Orbits: Paranasal sinuses and mastoid air cells are clear. Unremarkable orbits. Other: None. ASPECTS [facility] Stroke Program Early CT Score) - Ganglionic level infarction (caudate, lentiform nuclei, internal capsule, insula, M1-M3 cortex): 5 - Supraganglionic infarction (M4-M6 cortex): 2 Total score (0-10 with 10 being normal): 7 Review of the MIP images confirms the above findings CTA NECK FINDINGS Aortic arch: Standard 3 vessel aortic arch with widely patent arch vessel origins. Right carotid system: Patent with minimal atherosclerotic plaque at the carotid bifurcation. No evidence of stenosis or dissection. Left carotid system: Patent without evidence of stenosis or dissection. Vertebral arteries: Patent and codominant without  evidence of stenosis or dissection. Skeleton: Moderately large anterior vertebral osteophytes at C5-6 and C6-7. Other neck: No evidence of cervical lymphadenopathy or mass. Upper chest: Clear lung apices. Review of the MIP images confirms the above findings CTA HEAD FINDINGS Anterior circulation: The internal carotid arteries are patent from skull base to carotid termini with mild atherosclerotic plaque bilaterally not resulting in significant stenosis. The M1 segments are widely patent bilaterally, however there is occlusion of the left M2 inferior division near its origin. ACAs are patent with an azygos A2 configuration and no significant proximal stenosis. No aneurysm is identified. Posterior circulation: The intracranial vertebral arteries are widely patent to the basilar. Patent left PICA, right AICA, and bilateral SCA is are seen. The basilar artery is widely patent. Posterior communicating arteries are diminutive or absent. Both PCAs are patent without evidence of significant stenosis on the left. There are severe proximal P2 and P3 stenoses on the right. No aneurysm is identified. Venous sinuses: As permitted by contrast timing, patent. Anatomic variants: Azygos A2. Review of the MIP images confirms the above findings CT Brain Perfusion Findings: ASPECTS: 7 CBF (<30%) Volume: 20mL, however a core infarct is evident on CT and MRI Perfusion (Tmax>6.0s) volume: 47mL Mismatch Volume: The region of prolonged Tmax corresponds to the hypodensity on CT and diffusion abnormality on MRI, and no significant penumbra is present Infarction Location: Left MCA inferior division IMPRESSION: 1. Left MCA M2 inferior division occlusion. 2. Associated moderate-sized acute left MCA infarct without significant penumbra. 3. 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 4. Widely patent cervical carotid and vertebral arteries. These results were communicated to Dr. Laurence Slate at 9:30 am on 10/05/2019 by text page via the [facility] messaging  system. Electronically Signed   By: Sebastian Ache M.D.   On: 10/05/2019 09:46   ECHO TEE  Result Date: 10/06/2019    TRANSESOPHOGEAL ECHO REPORT   Patient Name:   ESTEBAN SALERA Date of Exam: 10/06/2019 Medical Rec #:  [id number]      Height:       62.0 in Accession #:    [id number]     Weight:       138.7 lb Date of Birth:  04-25-1956      BSA:          1.636 m Patient Age:    64 years       BP:           131/66 mmHg Patient Gender: F              HR:           67 bpm. Exam Location:  [facility] Procedure: Transesophageal Echo, Color Doppler, Cardiac Doppler and Saline            Contrast Bubble Study Indications:     Not listed on front sheet of order.  History:         Patient has prior history of Echocardiogram examinations, most                  recent 10/05/2019. No cardiac history listed.  Sonographer:     Cristela BlueJerry Hege RDCS (AE) Referring Phys:  [id number] Antonieta IbaIMOTHY J GOLLAN Diagnosing Phys: Julien Nordmannimothy Gollan MD PROCEDURE: After discussion of the risks and benefits of a TEE, an informed consent was obtained from the patient. TEE procedure time was 40 minutes. The transesophogeal probe was passed without difficulty through the esophogus of the patient. Imaged were obtained with the patient in a left lateral decubitus position. Local oropharyngeal anesthetic was provided with Cetacaine and viscous lidocaine. Sedation performed by performing physician. Patients was under conscious sedation during this procedure. Anesthetic administered: 150mcg of Fentanyl, 5.0mg  of Versed. Image quality was excellent. The patient's vital signs; including heart rate, blood pressure, and oxygen saturation; remained stable throughout the procedure. The patient developed no complications during the procedure. IMPRESSIONS  1. Left ventricular ejection fraction, by estimation, is 60 to 65%. The left ventricle has normal function. The left ventricle has no regional wall motion abnormalities. Left ventricular diastolic parameters are indeterminate.  2.  Right ventricular systolic function is normal. The right ventricular size is normal.  3. No left atrial/left atrial appendage thrombus was detected.  4. The mitral valve is normal in structure and function. No evidence of mitral valve regurgitation. No evidence of mitral stenosis.  5. The aortic valve is normal in structure and function. Aortic valve regurgitation is trivial. No aortic stenosis is present.  6. There is mild (Grade II) plaque.  7. The inferior vena cava is normal in size with greater than 50% respiratory variability, suggesting right atrial pressure of 3 mmHg. Conclusion(s)/Recommendation(s): Normal biventricular function without evidence of hemodynamically significant valvular heart disease. FINDINGS  Left Ventricle: Left ventricular ejection fraction, by estimation, is 60 to 65%. The left ventricle has normal function. The left ventricle has no regional wall motion abnormalities. The left ventricular internal cavity size was normal in size. There is  no left ventricular hypertrophy. Right Ventricle: The right ventricular size is normal. No increase in right ventricular wall thickness. Right ventricular systolic function is normal. Left Atrium: Left atrial size was normal in size. No left atrial/left atrial appendage thrombus was detected. Right Atrium: Right atrial size was normal in size. Pericardium: There is no evidence of pericardial effusion. Mitral Valve: The mitral valve is normal in structure and function. Normal mobility of the mitral valve leaflets. No evidence of mitral valve regurgitation. No evidence of mitral valve stenosis. Tricuspid Valve: The tricuspid valve is normal in structure. Tricuspid valve regurgitation is mild . No evidence of tricuspid stenosis. Aortic Valve: The aortic valve is normal in structure and function. Aortic valve regurgitation is trivial. No aortic stenosis is present. Pulmonic Valve: The pulmonic valve was normal in structure. Pulmonic valve regurgitation is  not visualized. No evidence of pulmonic stenosis. Aorta: The aortic root is normal in size and structure. There is mild (Grade II) plaque. Venous: The inferior vena cava is normal in size with greater than 50% respiratory variability, suggesting right atrial pressure of 3 mmHg. IAS/Shunts: No atrial level shunt detected by color flow Doppler. Agitated saline contrast was given intravenously to evaluate for intracardiac shunting. There is no evidence of a patent foramen ovale. There is no evidence of an atrial septal defect. Julien Nordmannimothy Gollan MD Electronically signed by Julien Nordmannimothy Gollan MD Signature Date/Time: 10/06/2019/1:14:03 PM  Final    ECHOCARDIOGRAM COMPLETE BUBBLE STUDY  Result Date: 10/05/2019    ECHOCARDIOGRAM REPORT   Patient Name:   ELIZBETH POSA Date of Exam: 10/05/2019 Medical Rec #:  [id number]      Height:       62.0 in Accession #:    [id number]     Weight:       138.7 lb Date of Birth:  1955-08-19      BSA:          1.636 m Patient Age:    8 years       BP:           150/61 mmHg Patient Gender: F              HR:           75 bpm. Exam Location:  [facility] Procedure: 2D Echo, Color Doppler, Cardiac Doppler and Saline Contrast Bubble            Study Indications:     I163.9 Stroke  History:         Patient has no prior history of Echocardiogram examinations.                  Signs/Symptoms:Confusion. No medical history.  Sonographer:     Charmayne Sheer RDCS (AE) Referring Phys:  [id number] Suann Larry Jamori Biggar Diagnosing Phys: Neoma Laming MD IMPRESSIONS  1. Left ventricular ejection fraction, by estimation, is 40 to 45%. The left ventricle has mildly decreased function. The left ventricle demonstrates regional wall motion abnormalities (see scoring diagram/findings for description). The left ventricular  internal cavity size was mildly dilated. Left ventricular diastolic parameters are consistent with Grade I diastolic dysfunction (impaired relaxation).  2. Right ventricular systolic function is normal. The  right ventricular size is normal.  3. The mitral valve is normal in structure and function. Trivial mitral valve regurgitation. No evidence of mitral stenosis.  4. The aortic valve is normal in structure and function. Aortic valve regurgitation is not visualized. No aortic stenosis is present.  5. The inferior vena cava is normal in size with greater than 50% respiratory variability, suggesting right atrial pressure of 3 mmHg. FINDINGS  Left Ventricle: Left ventricular ejection fraction, by estimation, is 40 to 45%. The left ventricle has mildly decreased function. The left ventricle demonstrates regional wall motion abnormalities. The left ventricular internal cavity size was mildly dilated. There is no left ventricular hypertrophy. Left ventricular diastolic parameters are consistent with Grade I diastolic dysfunction (impaired relaxation).  LV Wall Scoring: The mid anteroseptal segment and mid inferoseptal segment are normal. Right Ventricle: The right ventricular size is normal. No increase in right ventricular wall thickness. Right ventricular systolic function is normal. Left Atrium: Left atrial size was normal in size. Right Atrium: Right atrial size was normal in size. Pericardium: There is no evidence of pericardial effusion. Mitral Valve: The mitral valve is normal in structure and function. Normal mobility of the mitral valve leaflets. Trivial mitral valve regurgitation. No evidence of mitral valve stenosis. MV peak gradient, 2.6 mmHg. The mean mitral valve gradient is 1.0 mmHg. Tricuspid Valve: The tricuspid valve is normal in structure. Tricuspid valve regurgitation is trivial. No evidence of tricuspid stenosis. Aortic Valve: The aortic valve is normal in structure and function. Aortic valve regurgitation is not visualized. No aortic stenosis is present. Aortic valve mean gradient measures 3.0 mmHg. Aortic valve peak gradient measures 7.2 mmHg. Aortic valve area, by VTI measures 2.07 cm. Pulmonic  Valve: The pulmonic valve was normal in structure. Pulmonic valve regurgitation is not visualized. No evidence of pulmonic stenosis. Aorta: The aortic root is normal in size and structure. Venous: The inferior vena cava is normal in size with greater than 50% respiratory variability, suggesting right atrial pressure of 3 mmHg. IAS/Shunts: No atrial level shunt detected by color flow Doppler. Agitated saline contrast was given intravenously to evaluate for intracardiac shunting.  LEFT VENTRICLE PLAX 2D LVIDd:         3.99 cm  Diastology LVIDs:         3.22 cm  LV e' lateral:   9.36 cm/s LV PW:         1.00 cm  LV E/e' lateral: 6.4 LV IVS:        0.86 cm  LV e' medial:    7.29 cm/s LVOT diam:     1.90 cm  LV E/e' medial:  8.2 LV SV:         55 LV SV Index:   34 LVOT Area:     2.84 cm  RIGHT VENTRICLE RV Basal diam:  2.76 cm LEFT ATRIUM             Index       RIGHT ATRIUM          Index LA diam:        3.40 cm 2.08 cm/m  RA Area:     9.13 cm LA Vol (A2C):   22.7 ml 13.87 ml/m RA Volume:   17.00 ml 10.39 ml/m LA Vol (A4C):   55.1 ml 33.68 ml/m LA Biplane Vol: 38.4 ml 23.47 ml/m  AORTIC VALVE                   PULMONIC VALVE AV Area (Vmax):    2.12 cm    PV Vmax:       1.05 m/s AV Area (Vmean):   2.10 cm    PV Vmean:      66.900 cm/s AV Area (VTI):     2.07 cm    PV VTI:        0.219 m AV Vmax:           134.00 cm/s PV Peak grad:  4.4 mmHg AV Vmean:          87.200 cm/s PV Mean grad:  2.0 mmHg AV VTI:            0.267 m AV Peak Grad:      7.2 mmHg AV Mean Grad:      3.0 mmHg LVOT Vmax:         100.00 cm/s LVOT Vmean:        64.700 cm/s LVOT VTI:          0.195 m LVOT/AV VTI ratio: 0.73  AORTA Ao Root diam: 2.60 cm MITRAL VALVE MV Area (PHT): 3.60 cm    SHUNTS MV Peak grad:  2.6 mmHg    Systemic VTI:  0.20 m MV Mean grad:  1.0 mmHg    Systemic Diam: 1.90 cm MV Vmax:       0.81 m/s MV Vmean:      57.1 cm/s MV Decel Time: 211 msec MV E velocity: 60.10 cm/s MV A velocity: 80.00 cm/s MV E/A ratio:  0.75 Adrian Blackwater MD Electronically signed by Adrian Blackwater MD Signature Date/Time: 10/05/2019/2:56:18 PM    Final         Scheduled Meds: . aspirin  300 mg Rectal  Daily   Or  . aspirin EC  325 mg Oral Daily  . enoxaparin (LOVENOX) injection  40 mg Subcutaneous Q24H  . fentaNYL      . fentaNYL      . midazolam      . rosuvastatin  40 mg Oral q1800  . sodium chloride flush       Continuous Infusions:    LOS: 1 day    Time spent: 25 minutes    Alberteen Samhristopher P Meagen Limones, MD [facility] 10/06/2019, 5:57 PM     Please page though AMION or Epic secure chat:  For [employer] password, Higher education careers advisercontact charge nurse

## 2019-10-06 NOTE — Progress Notes (Signed)
Physical Therapy Treatment Patient Details Name: Robin Mosley MRN: [id number] DOB: [birth year] Today's Date: 10/06/2019    History of Present Illness 64 y.o. female with PMH of devrticulitis, IBS presents to the ED with altered mental status when family woke her up at 3AM [place] the airport.  Patient's last known normal was 9 PM when she went to sleep.  When family went to get her up, they found her wandering in the house acting very confused and not making sense while speaking.  Patient was brought to [facility]. ED courseA stat CT head was performed which showed early acute ischemic stroke changes in the left MCA territory.  An MRI brain was subsequently performed which showed a large left MCA infarct.  Patient was admitted to hospital service and started on aspirin.  She was outside the window for receiving TPA.    PT Comments    Pt with some increased verbalization this date, but frankly continues to have significant expressive aphasia as well as motor planning/processing issues.  She was able to ambulate with increased speed and less need for directional input as well as negotiate a flight of steps but pt is still lacking in safety awareness with most tasks and continues to require close supervision and guidance.  She scored a 40/56 on the Berg - though still number is likely lower than her actual balance is secondary to processing and directional issues.  Pt very eager to work with PT and pleasant t/o the effort, clearly frustrated at times with her inability to express what she is trying to say.    Follow Up Recommendations  Home health PT;Supervision/Assistance - 24 hour     Equipment Recommendations  None recommended by PT    Recommendations for Other Services       Precautions / Restrictions Precautions Precautions: Fall Restrictions Weight Bearing Restrictions: No    Mobility  Bed Mobility Overal bed mobility: Modified Independent              General bed mobility comments: Pt able to transition to sitting in a timely manage once cued  Transfers Overall transfer level: Modified independent Equipment used: None Transfers: Sit to/from Stand Sit to Stand: Supervision         General transfer comment: Pt was able to rise to standing with good confidence and showed no balance issues with the effort  Ambulation/Gait Ambulation/Gait assistance: Supervision Gait Distance (Feet): 350 Feet         General Gait Details: Pt with consistent and safe ambulation.  She was able to follow directional cues better this date and though still displaying some R neglect showed more awareness of obstacles, etc with no LOBs, foot drop or phyiscally limited safety concners   Stairs Stairs: Yes Stairs assistance: Supervision Stair Management: One rail Left;Step to pattern Number of Stairs: 13 General stair comments: Pt safe taking steps one at a time but did do a few reciprocal steps with increased reliance on hand.  Pt did fail to let go of rail with R hand once on the landing and started to walk while arm pulled behind   Wheelchair Mobility    Modified Rankin (Stroke Patients Only)       Balance Overall balance assessment: Needs assistance Sitting-balance support: No upper extremity supported Sitting balance-Leahy Scale: Good     Standing balance support: No upper extremity supported Standing balance-Leahy Scale: Fair Standing balance comment: able to maintain balance statically, rare stagger steps with ambulation  in hallway w/o ADs with no overt LOBs                 Standardized Balance Assessment Standardized Balance Assessment : Berg Balance Test Berg Balance Test Sit to Stand: Able to stand without using hands and stabilize independently Standing Unsupported: Able to stand safely 2 minutes Sitting with Back Unsupported but Feet Supported on Floor or Stool: Able to sit safely and securely 2 minutes Stand to Sit:  Sits safely with minimal use of hands Transfers: Able to transfer safely, minor use of hands Standing Unsupported with Eyes Closed: Able to stand 10 seconds with supervision Standing Ubsupported with Feet Together: Able to place feet together independently and stand for 1 minute with supervision From Standing, Reach Forward with Outstretched Arm: Can reach forward >12 cm safely (5") From Standing Position, Pick up Object from Floor: Able to pick up shoe, needs supervision From Standing Position, Turn to Look Behind Over each Shoulder: Turn sideways only but maintains balance Turn 360 Degrees: Able to turn 360 degrees safely but slowly Standing Unsupported, Alternately Place Feet on Step/Stool: Able to complete >2 steps/needs minimal assist Standing Unsupported, One Foot in Front: Able to take small step independently and hold 30 seconds Standing on One Leg: Tries to lift leg/unable to hold 3 seconds but remains standing independently Total Score: 40        Cognition Arousal/Alertness: Awake/alert Behavior During Therapy: WFL for tasks assessed/performed(appropriate, still slow to process but less impulsivity) Overall Cognitive Status: Impaired/Different from baseline                                 General Comments: Pt was able to participate well with PT but unable to process a majority of even simple commands, needed extra cuing and time with only intermittent success      Exercises Other Exercises Other Exercises: multiple standing balance and coordination exercises, including reaching to moving targets outside BOS b/l, motor planning and fine motor challenges exercises, heels raises and marching/SLS with light faded UE support - multiple reps each with extra cuing and time needed most of the time to perform properly (and at times unable to process despite extra time/cuing)    General Comments        Pertinent Vitals/Pain Pain Assessment: No/denies pain    Home  Living                      Prior Function            PT Goals (current goals can now be found in the care plan section) Progress towards PT goals: Progressing toward goals    Frequency    7X/week      PT Plan Current plan remains appropriate    Co-evaluation              AM-PAC PT "6 Clicks" Mobility   Outcome Measure  Help needed turning from your back to your side while in a flat bed without using bedrails?: None Help needed moving from lying on your back to sitting on the side of a flat bed without using bedrails?: None Help needed moving to and from a bed to a chair (including a wheelchair)?: None Help needed standing up from a chair using your arms (e.g., wheelchair or bedside chair)?: None Help needed to walk in hospital room?: None Help needed climbing 3-5 steps with a railing? : None  6 Click Score: 24    End of Session Equipment Utilized During Treatment: Gait belt Activity Tolerance: Patient tolerated treatment well Patient left: with chair alarm set;with call bell/phone within reach Nurse Communication: Mobility status PT Visit Diagnosis: Hemiplegia and hemiparesis;Difficulty in walking, not elsewhere classified (R26.2);Apraxia (R48.2);Muscle weakness (generalized) (M62.81) Hemiplegia - Right/Left: Right Hemiplegia - dominant/non-dominant: Non-dominant Hemiplegia - caused by: Cerebral infarction     Time: 0810-0858 PT Time Calculation (min) (ACUTE ONLY): 48 min  Charges:  $Gait Training: 8-22 mins $Therapeutic Activity: 23-37 mins                     Kreg Shropshire, DPT 10/06/2019, 9:49 AM

## 2019-10-06 NOTE — TOC Initial Note (Signed)
Transition of Care [facility]) - Initial/Assessment Note    Patient Details  Name: Robin Mosley MRN: [id number] Date of Birth: 01-Jul-1956  Transition of Care [facility]) CM/SW Contact:    Su Hilt, RN Phone Number: 10/06/2019, 9:29 AM  Clinical Narrative:  Spoke with the patient's daughter Shawna Orleans on the phone, She lives in [place] and has been here visiting, she had planned to go back to [place] in a couple of weeks but agrees to saty longer to make sure that things are set up for her mom, I explained the patient will not be safe to live alone due to the cognitive issues from the stroke, I explained I was going to get [facility] set up for speech and OT, She stated that the patient's brother lives in [place] and that he was here visiting to get his mother to take back to [place] with him, She is going to speak to the patient's brother to see if he could possibly take the patient to [facility] with him as well.  I encouraged her to apply for Medicaid and Disability ASAP and explained Medicaid will be state to state so if she went to [place] it would need to be applied for there.  She states understanding, I explained that the doctor is looking to DC the patient home today as she is medically stable and it will take time and therapy to improve.  She stated understanding                Expected Discharge Plan: [place] Barriers to Discharge: Continued Medical Work up   Patient Goals and CMS Choice Patient states their goals for this hospitalization and ongoing recovery are:: go home      Expected Discharge Plan and Services Expected Discharge Plan: [place]       Living arrangements for the past 2 months: Single Family Home                 DME Arranged: N/A         HH Arranged: OT, Speech Therapy [place]: [facility] [employer] Date [place]: 10/06/19 Time [place]: [phone] Representative spoke with at [place]: [place] Arrangements/Services Living arrangements for the past 2 months: [place] with:: Self Patient language and need for interpreter reviewed:: Yes        Need for Family Participation in Patient Care: No (Comment) Care giver support system in place?: Yes (comment)   Criminal Activity/Legal Involvement Pertinent to Current Situation/Hospitalization: No - Comment as needed  Activities of Daily Living Home Assistive Devices/Equipment: None ADL Screening (condition at time of admission) Patient's cognitive ability adequate to safely complete daily activities?: Yes Is the patient deaf or have difficulty hearing?: No Does the patient have difficulty seeing, even when wearing glasses/contacts?: No Does the patient have difficulty concentrating, remembering, or making decisions?: No Patient able to express need for assistance with ADLs?: Yes Does the patient have difficulty dressing or bathing?: No Independently performs ADLs?: Yes (appropriate for developmental age) Does the patient have difficulty walking or climbing stairs?: No Weakness of Legs: None Weakness of Arms/Hands: None  Permission Sought/Granted Permission sought to share information with : Family Supports, Case Manager Permission granted to share information with : Yes, Verbal Permission Granted              Emotional Assessment Appearance:: Appears stated age Attitude/Demeanor/Rapport: Engaged Affect (typically  observed): Appropriate Orientation: : Oriented to Self, Oriented to Situation, Oriented to Place, Oriented to  Time Alcohol / Substance Use: Not Applicable Psych Involvement: No (comment)  Admission diagnosis:  CVA (cerebral vascular accident) (HCC) [I63.9] Acute ischemic stroke [facility]) [I63.9] Patient Active Problem List   Diagnosis Date Noted  . Acute ischemic stroke (HCC) 10/05/2019   PCP:  Patient, No Pcp Per Pharmacy:   [id number] [place]  - [address] AT [facility] OF [address] [place] [address] Phone: [phone] Fax: [phone]     Social Determinants of Health (SDOH) Interventions    Readmission Risk Interventions No flowsheet data found.

## 2019-10-06 NOTE — Progress Notes (Signed)
Reason for consult: Stroke   Subjective: Patient awake, walking down the hallway with PT. Is pleasant, still cannot follow commands.    ROS: negative except above  Examination  Vital signs in last 24 hours: Temp:  [97.6 F (36.4 C)-98.5 F (36.9 C)] 98.3 F (36.8 C) (02/26 1314) Pulse Rate:  [67-99] 70 (02/26 1314) Resp:  [10-20] 17 (02/26 1314) BP: (125-186)/(53-100) 134/67 (02/26 1314) SpO2:  [93 %-98 %] 98 % (02/26 1314)  General: lying in bed CVS: pulse-normal rate and rhythm RS: breathing comfortably Extremities: normal   Neuro: MS: Alert, aphasic. Can state her name and daughter's name but answers no other questions. Cannot follow commands, struggles with command to close eyes.  CN: pupils equal and reactive,  EOMI, face symmetric, tongue midline, normal sensation over face, Motor: 4+/5 in right leg and arm, 5/5 strength in right arm and right leg Reflexes: plantars: flexor Coordination: no gross ataxia Gait: not tested  Basic Metabolic Panel: Recent Labs  Lab 10/05/19 0419 10/06/19 0525  NA 142 141  K 3.3* 4.2  CL 105 107  CO2 28 28  GLUCOSE 100* 94  BUN 11 8  CREATININE 0.67 0.66  CALCIUM 8.7* 8.7*  MG 2.0  --     CBC: Recent Labs  Lab 10/05/19 0419  WBC 7.8  NEUTROABS 5.3  HGB 13.4  HCT 40.3  MCV 85.0  PLT 292     Coagulation Studies: Recent Labs    10/05/19 0419  LABPROT 12.1  INR 0.9    Imaging Reviewed:     ASSESSMENT AND PLAN 64 y.o. female with PMH of devrticulitis, IBS presents to the ED with altered mental status when family woke her up at 3AM [place] the airport-noted to be aphasic and have right-sided weakness.  CT head already showed acute left MCA changes and MRI brain confirmed large infarct in the MCA territory.  CT angiogram shows a left M2 inferior division occlusion and severe proximal P2 and P3 stenosis. CTP : The region of prolonged Tmax corresponds to the hypodensity on CT and diffusion abnormality on MRI,  and no significant penumbra is present.   Not a candidate for TPA as she is outside the window and not can for LVO due to already established stroke with no penumbra.  I suspect etiology of stroke is embolic, likely cardioembolic  CT head repeated last night due to nurse concern of pupil asymmetry. CT Head showed stable 84mm midline shift.    Impression:   Left MCA territory Acute Ischemic Stroke  Left MCA M2 inferior division occlusion Aphasia Right-sided hemiparesis: improving  Mild Cerebral Edema with 39mm midline shift  Hyperlipidemia Tobacco abuse   Risk factors: cigarette smoking, hyperlipidemia  Etiology: Embolic stroke of undetermined source (ESUS)   Stroke workup included:  Imaging studies including vascular imaging :  #CT Head: Acute infarct affecting the superficial left temporal lobe andfrontal parietal junction, inferior division MCA territory. No acute hemorrhage.  #CT Angiogram of head and neck: Left MCA M2 inferior division occlusion.Severe proximal right P2 and P3 stenoses.Widely patent cervical carotid and vertebral arteries.  #CT perfusion:The region of prolonged Tmax corresponds to the hypodensity on CT and diffusion abnormality on MRI no significant penumbra is present #MRI Brain: Large acute infarct in the inferior division left MCA territory affecting temporal, posterior frontal, and parietal cortex.  #CT head repeated on 2/25 due to pupil asymmetry : A moderate to large left MCA vascular territory acute infarction is similar in extent when  comparing with the MRI performed earlier today. No hemorrhagic conversion. Mild mass effect with unchanged 2 mm rightward midline shift.  #Transthoracic Echo with bubble study: negative for PFO, EF normal  # TEE: no thrombus, PFO, vegetation  # Blood cultures: negative  #Urine drug screen: THC # HBAIC and Lipid profile: LDL 150 # Telemetry monitoring : no atrial fibrillation  # Stroke swallow screen:  passed #Speech therapy evaluation, PT and OT: home with therapy    Recommendations:  # Continue patient on ASA 325mg  daily on discharge #Start or continue Atorvastatin 40 mg daily # BP goal: less than 140/90 mmHg # Frequent Neurochecks   Will need outpatient neurology follow up, preferably with stroke specialist.  Continue with physical and speech therapy   Karena Addison Gypsy [facility] Pager Number [phone] For questions after 7pm please refer to AMION to reach the Neurologist on call

## 2019-10-07 MED ORDER — ROSUVASTATIN CALCIUM 40 MG PO TABS: 40.0000 mg | ORAL_TABLET | Freq: Every day | ORAL | 3 refills | Status: DC

## 2019-10-07 MED ORDER — AMLODIPINE BESYLATE 5 MG PO TABS: 5.0000 mg | ORAL_TABLET | Freq: Every day | ORAL | 11 refills | Status: DC

## 2019-10-07 MED ORDER — ASPIRIN 325 MG PO TBEC: 325.0000 mg | DELAYED_RELEASE_TABLET | Freq: Every day | ORAL | 0 refills | Status: DC

## 2019-10-07 NOTE — Discharge Summary (Signed)
Physician Discharge Summary  DALA BREAULT ZOX:[id number] DOB: 06-Aug-1956 DOA: 10/05/2019  PCP: Patient, No Pcp Per  Admit date: 10/05/2019 Discharge date: 10/07/2019  Admitted From: Home  Disposition:  Home with [facility]   Recommendations for Outpatient Follow-up:  1. Follow up with Neurology Dr. Sherryll Burger in 4-6 weeks 2. Establish with new PCP as soon as possible for management of hypertension 3. Please obtain Lipids, CK, transaminases in 3 months   Home Health: PT/OT/speech given patient's severe expressive aphasia, inability to leave home safely alone Equipment/Devices:   Discharge Condition: Fair CODE STATUS: Full Diet recommendation: Cardiac  Brief/Interim Summary: Mrs. Nielson is a 64 y.o. F with hx IBS, smoking who presented with acute aphasia.  Patient LSN at 9PM evening prior to presentation.  At 3AM, family found her wandering in house, aphasic.  In the ER, CT head showed acute left MCA infarct.  CODE STROKE not called, Neurology not consulted.  EtOH negative, cannabis UDS positive.  Patient given aspirin and admitted to hospitalist service.     PRINCIPAL HOSPITAL DIAGNOSIS: Acute stroke    Discharge Diagnoses:   Acute stroke CT head on admission showed acute stroke. Neurology was not contacted by EDP.    Subsequent MRI brain showed  "Large acute infarct in the inferior division left MCA territory affecting temporal, posterior frontal, and parietal cortex".  -Non-invasive angiography showed no significant carotid disease or other atherosclerosis  Given patchy infarcts of differing ages, embolic source suspected.   -TEE performed, showed no atrial clot, no PFO, normal EF  Lipids ordered: LDL 150, started Crestor 40 daily Aspirin ordered at admission --> discharged on aspirin 325  Atrial fibrillation:  Monitored continuously for 48 hours with no atrial fibrillation.  ILR arranged as an outpatient.  Smoking cessation:  recommended    Hypokalemia Mild          Discharge Instructions  Discharge Instructions    Diet - low sodium heart healthy   Complete by: As directed    Discharge instructions   Complete by: As directed    From Dr. Maryfrances Bunnell: You were admitted for stroke. Your stroke may have been from an "embolism" meaning, a bit of debris from elsewhere in the body. The testing in the hospital to discover where it came from were all unrevealing.  The last test to discover where the embolism came from is an implantable loop recorder.   The Cardiology office will call you about having this placed.  In the meantime, call a primary doctor to establish care  Start taking aspirin 325 mg daily Take rosuvastatin/Crestor 40 mg nightly Eat a low saturated fat diet  For blood pressure control (you have high blood pressure) take amlodipine 5 mg daily  Follow up with a stroke specialist, Dr. Cristopher Peru in 4-6 weeks (see below)   Increase activity slowly   Complete by: As directed      Allergies as of 10/07/2019      Reactions   Sulfa Antibiotics Hives   Tetracyclines & Related Hives      Medication List    STOP taking these medications   acetaminophen 500 MG tablet Commonly known as: TYLENOL     TAKE these medications   amLODipine 5 MG tablet Commonly known as: NORVASC Take 1 tablet (5 mg total) by mouth daily.   aspirin 325 MG EC tablet Take 1 tablet (325 mg total) by mouth daily. Start taking on: October 08, 2019   rosuvastatin 40 MG tablet Commonly known as: CRESTOR Take  1 tablet (40 mg total) by mouth daily at 6 PM.      Follow-up Information    Lonell Face, MD. Schedule an appointment as soon as possible for a visit in 4 week(s).   Specialty: Neurology Contact information: [phone] [facility] [address] [facility] [place] [address] [phone]          Allergies  Allergen Reactions  . Sulfa Antibiotics Hives  . Tetracyclines &  Related Hives    Consultations:  Neurology   Procedures/Studies: CT ANGIO HEAD W OR WO CONTRAST  Result Date: 10/05/2019 CLINICAL DATA:  Acute left MCA infarct. Confusion, expressive aphasia, and right-sided neglect. EXAM: CT ANGIOGRAPHY HEAD AND NECK CT PERFUSION BRAIN TECHNIQUE: Multidetector CT imaging of the head and neck was performed using the standard protocol during bolus administration of intravenous contrast. Multiplanar CT image reconstructions and MIPs were obtained to evaluate the vascular anatomy. Carotid stenosis measurements (when applicable) are obtained utilizing NASCET criteria, using the distal internal carotid diameter as the denominator. Multiphase CT imaging of the brain was performed following IV bolus contrast injection. Subsequent parametric perfusion maps were calculated using RAPID software. CONTRAST:  OMNIPAQUE IOHEXOL 350 MG/ML SOLN COMPARISON:  Head CT and MRI 10/05/2019 FINDINGS: CT HEAD FINDINGS Brain: A moderate-sized acute left MCA territory infarct is again seen involving the left temporal, parietal, and posterior frontal lobes. Cytotoxic edema has mildly increased compared to today's earlier CT, and the distribution of the infarct is unchanged from the MRI. No acute intracranial hemorrhage, midline shift, or extra-axial fluid collection is identified. The ventricles are normal in size. Vascular: Calcified atherosclerosis at the skull base. Skull: No fracture or suspicious osseous lesion. Sinuses/Orbits: Paranasal sinuses and mastoid air cells are clear. Unremarkable orbits. Other: None. ASPECTS [facility] Stroke Program Early CT Score) - Ganglionic level infarction (caudate, lentiform nuclei, internal capsule, insula, M1-M3 cortex): 5 - Supraganglionic infarction (M4-M6 cortex): 2 Total score (0-10 with 10 being normal): 7 Review of the MIP images confirms the above findings CTA NECK FINDINGS Aortic arch: Standard 3 vessel aortic arch with widely patent arch vessel  origins. Right carotid system: Patent with minimal atherosclerotic plaque at the carotid bifurcation. No evidence of stenosis or dissection. Left carotid system: Patent without evidence of stenosis or dissection. Vertebral arteries: Patent and codominant without evidence of stenosis or dissection. Skeleton: Moderately large anterior vertebral osteophytes at C5-6 and C6-7. Other neck: No evidence of cervical lymphadenopathy or mass. Upper chest: Clear lung apices. Review of the MIP images confirms the above findings CTA HEAD FINDINGS Anterior circulation: The internal carotid arteries are patent from skull base to carotid termini with mild atherosclerotic plaque bilaterally not resulting in significant stenosis. The M1 segments are widely patent bilaterally, however there is occlusion of the left M2 inferior division near its origin. ACAs are patent with an azygos A2 configuration and no significant proximal stenosis. No aneurysm is identified. Posterior circulation: The intracranial vertebral arteries are widely patent to the basilar. Patent left PICA, right AICA, and bilateral SCA is are seen. The basilar artery is widely patent. Posterior communicating arteries are diminutive or absent. Both PCAs are patent without evidence of significant stenosis on the left. There are severe proximal P2 and P3 stenoses on the right. No aneurysm is identified. Venous sinuses: As permitted by contrast timing, patent. Anatomic variants: Azygos A2. Review of the MIP images confirms the above findings CT Brain Perfusion Findings: ASPECTS: 7 CBF (<30%) Volume: 0mL, however a core infarct is evident on CT  and MRI Perfusion (Tmax>6.0s) volume: 82mL Mismatch Volume: The region of prolonged Tmax corresponds to the hypodensity on CT and diffusion abnormality on MRI, and no significant penumbra is present Infarction Location: Left MCA inferior division IMPRESSION: 1. Left MCA M2 inferior division occlusion. 2. Associated moderate-sized  acute left MCA infarct without significant penumbra. 3. 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 4. Widely patent cervical carotid and vertebral arteries. These results were communicated to Dr. Lorraine Lax at 9:30 am on 10/05/2019 by text page via the [facility] messaging system. Electronically Signed   By: Logan Bores M.D.   On: 10/05/2019 09:46   CT HEAD WO CONTRAST  Result Date: 10/05/2019 CLINICAL DATA:  Neuro deficit, subacute. EXAM: CT HEAD WITHOUT CONTRAST TECHNIQUE: Contiguous axial images were obtained from the base of the skull through the vertex without intravenous contrast. COMPARISON:  CT angiogram head/neck and CT perfusion 10/05/2019, brain MRI 10/05/2019. FINDINGS: Brain: Again demonstrated is a moderate to large acute cortical/subcortical infarct within the left MCA vascular territory predominantly involving the left parietal and temporal lobes, but also involving portions of the posterior left frontal lobe. The infarct is similar in extent as compared to the MRI performed earlier today. No evidence of hemorrhagic conversion. Mild mass effect with 2 mm rightward midline shift, similar to prior MRI. No hydrocephalus or extra-axial fluid collection. Cerebral volume is normal for age. Vascular: A left M2 proximal branch occlusion was demonstrated on CTA head performed earlier today. No abnormal vascular hyperdensity identified elsewhere. Skull: No calvarial fracture or suspicious osseous lesion. Sinuses/Orbits: A moderate IMPRESSION: A moderate to large left MCA vascular territory acute infarction is similar in extent when comparing with the MRI performed earlier today. No hemorrhagic conversion. Mild mass effect with unchanged 2 mm rightward midline shift. Electronically Signed   By: Kellie Simmering DO   On: 10/05/2019 19:36   CT HEAD WO CONTRAST  Result Date: 10/05/2019 CLINICAL DATA:  Confusion.  Right-sided weakness. EXAM: CT HEAD WITHOUT CONTRAST TECHNIQUE: Contiguous axial images were obtained from  the base of the skull through the vertex without intravenous contrast. COMPARISON:  None. FINDINGS: Brain: Sizable area of cytotoxic edema in the superficial left temporal lobe and even more defined in the left frontal parietal region, inferior division MCA territory. No acute hemorrhage. No hydrocephalus or shift. Elsewhere the brain has a normal appearance Vascular: No hyperdense vessel. Skull: Normal Sinuses/Orbits: Normal These results were called by telephone at the time of interpretation on 10/05/2019 at 4:38 am to provider [facility] , who verbally acknowledged these results. IMPRESSION: Acute infarct affecting the superficial left temporal lobe and frontal parietal junction, inferior division MCA territory. No acute hemorrhage. Electronically Signed   By: Monte Fantasia M.D.   On: 10/05/2019 04:38   CT ANGIO NECK W OR WO CONTRAST  Result Date: 10/05/2019 CLINICAL DATA:  Acute left MCA infarct. Confusion, expressive aphasia, and right-sided neglect. EXAM: CT ANGIOGRAPHY HEAD AND NECK CT PERFUSION BRAIN TECHNIQUE: Multidetector CT imaging of the head and neck was performed using the standard protocol during bolus administration of intravenous contrast. Multiplanar CT image reconstructions and MIPs were obtained to evaluate the vascular anatomy. Carotid stenosis measurements (when applicable) are obtained utilizing NASCET criteria, using the distal internal carotid diameter as the denominator. Multiphase CT imaging of the brain was performed following IV bolus contrast injection. Subsequent parametric perfusion maps were calculated using RAPID software. CONTRAST:  130mL OMNIPAQUE IOHEXOL 350 MG/ML SOLN COMPARISON:  Head CT and MRI 10/05/2019 FINDINGS: CT HEAD FINDINGS  Brain: A moderate-sized acute left MCA territory infarct is again seen involving the left temporal, parietal, and posterior frontal lobes. Cytotoxic edema has mildly increased compared to today's earlier CT, and the distribution of the  infarct is unchanged from the MRI. No acute intracranial hemorrhage, midline shift, or extra-axial fluid collection is identified. The ventricles are normal in size. Vascular: Calcified atherosclerosis at the skull base. Skull: No fracture or suspicious osseous lesion. Sinuses/Orbits: Paranasal sinuses and mastoid air cells are clear. Unremarkable orbits. Other: None. ASPECTS [facility] Stroke Program Early CT Score) - Ganglionic level infarction (caudate, lentiform nuclei, internal capsule, insula, M1-M3 cortex): 5 - Supraganglionic infarction (M4-M6 cortex): 2 Total score (0-10 with 10 being normal): 7 Review of the MIP images confirms the above findings CTA NECK FINDINGS Aortic arch: Standard 3 vessel aortic arch with widely patent arch vessel origins. Right carotid system: Patent with minimal atherosclerotic plaque at the carotid bifurcation. No evidence of stenosis or dissection. Left carotid system: Patent without evidence of stenosis or dissection. Vertebral arteries: Patent and codominant without evidence of stenosis or dissection. Skeleton: Moderately large anterior vertebral osteophytes at C5-6 and C6-7. Other neck: No evidence of cervical lymphadenopathy or mass. Upper chest: Clear lung apices. Review of the MIP images confirms the above findings CTA HEAD FINDINGS Anterior circulation: The internal carotid arteries are patent from skull base to carotid termini with mild atherosclerotic plaque bilaterally not resulting in significant stenosis. The M1 segments are widely patent bilaterally, however there is occlusion of the left M2 inferior division near its origin. ACAs are patent with an azygos A2 configuration and no significant proximal stenosis. No aneurysm is identified. Posterior circulation: The intracranial vertebral arteries are widely patent to the basilar. Patent left PICA, right AICA, and bilateral SCA is are seen. The basilar artery is widely patent. Posterior communicating arteries are  diminutive or absent. Both PCAs are patent without evidence of significant stenosis on the left. There are severe proximal P2 and P3 stenoses on the right. No aneurysm is identified. Venous sinuses: As permitted by contrast timing, patent. Anatomic variants: Azygos A2. Review of the MIP images confirms the above findings CT Brain Perfusion Findings: ASPECTS: 7 CBF (<30%) Volume: 54mL, however a core infarct is evident on CT and MRI Perfusion (Tmax>6.0s) volume: 34mL Mismatch Volume: The region of prolonged Tmax corresponds to the hypodensity on CT and diffusion abnormality on MRI, and no significant penumbra is present Infarction Location: Left MCA inferior division IMPRESSION: 1. Left MCA M2 inferior division occlusion. 2. Associated moderate-sized acute left MCA infarct without significant penumbra. 3. 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 4. Widely patent cervical carotid and vertebral arteries. These results were communicated to Dr. Laurence Slate at 9:30 am on 10/05/2019 by text page via the [facility] messaging system. Electronically Signed   By: Sebastian Ache M.D.   On: 10/05/2019 09:46   MR BRAIN WO CONTRAST  Result Date: 10/05/2019 CLINICAL DATA:  Ataxia with stroke suspected EXAM: MRI HEAD WITHOUT CONTRAST TECHNIQUE: Multiplanar, multiecho pulse sequences of the brain and surrounding structures were obtained without intravenous contrast. COMPARISON:  Head CT from earlier today FINDINGS: Brain: Large area of restricted diffusion in the superficial left temporal cortex and more patchy in the posterior left frontal and parietal cortex. The more inferior infarct has a somewhat more hazy appearance. Based on CT the infarcts may be of slightly different ages, although both acute. No acute hemorrhage, hydrocephalus, or masslike finding. No pre-existing infarct affecting the cortex. Small vessel type changes are  minimal and less than expected for age. Brain volume is normal Vascular: Normal proximal flow voids. Skull and  upper cervical spine: Normal marrow signal Sinuses/Orbits: Negative IMPRESSION: 1. Large acute infarct in the inferior division left MCA territory affecting temporal, posterior frontal, and parietal cortex. 2. No hemorrhagic conversion.  No pre-existing infarct. Electronically Signed   By: Marnee Spring M.D.   On: 10/05/2019 06:49   CT CEREBRAL PERFUSION W CONTRAST  Result Date: 10/05/2019 CLINICAL DATA:  Acute left MCA infarct. Confusion, expressive aphasia, and right-sided neglect. EXAM: CT ANGIOGRAPHY HEAD AND NECK CT PERFUSION BRAIN TECHNIQUE: Multidetector CT imaging of the head and neck was performed using the standard protocol during bolus administration of intravenous contrast. Multiplanar CT image reconstructions and MIPs were obtained to evaluate the vascular anatomy. Carotid stenosis measurements (when applicable) are obtained utilizing NASCET criteria, using the distal internal carotid diameter as the denominator. Multiphase CT imaging of the brain was performed following IV bolus contrast injection. Subsequent parametric perfusion maps were calculated using RAPID software. CONTRAST:  OMNIPAQUE IOHEXOL 350 MG/ML SOLN COMPARISON:  Head CT and MRI 10/05/2019 FINDINGS: CT HEAD FINDINGS Brain: A moderate-sized acute left MCA territory infarct is again seen involving the left temporal, parietal, and posterior frontal lobes. Cytotoxic edema has mildly increased compared to today's earlier CT, and the distribution of the infarct is unchanged from the MRI. No acute intracranial hemorrhage, midline shift, or extra-axial fluid collection is identified. The ventricles are normal in size. Vascular: Calcified atherosclerosis at the skull base. Skull: No fracture or suspicious osseous lesion. Sinuses/Orbits: Paranasal sinuses and mastoid air cells are clear. Unremarkable orbits. Other: None. ASPECTS [facility] Stroke Program Early CT Score) - Ganglionic level infarction (caudate, lentiform nuclei, internal  capsule, insula, M1-M3 cortex): 5 - Supraganglionic infarction (M4-M6 cortex): 2 Total score (0-10 with 10 being normal): 7 Review of the MIP images confirms the above findings CTA NECK FINDINGS Aortic arch: Standard 3 vessel aortic arch with widely patent arch vessel origins. Right carotid system: Patent with minimal atherosclerotic plaque at the carotid bifurcation. No evidence of stenosis or dissection. Left carotid system: Patent without evidence of stenosis or dissection. Vertebral arteries: Patent and codominant without evidence of stenosis or dissection. Skeleton: Moderately large anterior vertebral osteophytes at C5-6 and C6-7. Other neck: No evidence of cervical lymphadenopathy or mass. Upper chest: Clear lung apices. Review of the MIP images confirms the above findings CTA HEAD FINDINGS Anterior circulation: The internal carotid arteries are patent from skull base to carotid termini with mild atherosclerotic plaque bilaterally not resulting in significant stenosis. The M1 segments are widely patent bilaterally, however there is occlusion of the left M2 inferior division near its origin. ACAs are patent with an azygos A2 configuration and no significant proximal stenosis. No aneurysm is identified. Posterior circulation: The intracranial vertebral arteries are widely patent to the basilar. Patent left PICA, right AICA, and bilateral SCA is are seen. The basilar artery is widely patent. Posterior communicating arteries are diminutive or absent. Both PCAs are patent without evidence of significant stenosis on the left. There are severe proximal P2 and P3 stenoses on the right. No aneurysm is identified. Venous sinuses: As permitted by contrast timing, patent. Anatomic variants: Azygos A2. Review of the MIP images confirms the above findings CT Brain Perfusion Findings: ASPECTS: 7 CBF (<30%) Volume: 9mL, however a core infarct is evident on CT and MRI Perfusion (Tmax>6.0s) volume: 81mL Mismatch Volume: The  region of prolonged Tmax corresponds to the hypodensity on CT and  diffusion abnormality on MRI, and no significant penumbra is present Infarction Location: Left MCA inferior division IMPRESSION: 1. Left MCA M2 inferior division occlusion. 2. Associated moderate-sized acute left MCA infarct without significant penumbra. 3. 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 4. Widely patent cervical carotid and vertebral arteries. These results were communicated to Dr. Laurence Slate at 9:30 am on 10/05/2019 by text page via the [facility] messaging system. Electronically Signed   By: Sebastian Ache M.D.   On: 10/05/2019 09:46   ECHO TEE  Result Date: 10/06/2019    TRANSESOPHOGEAL ECHO REPORT   Patient Name:   JESUSA STENERSON Date of Exam: 10/06/2019 Medical Rec #:  [id number]      Height:       62.0 in Accession #:    [id number]     Weight:       138.7 lb Date of Birth:  September 21, 1955      BSA:          1.636 m Patient Age:    64 years       BP:           131/66 mmHg Patient Gender: F              HR:           67 bpm. Exam Location:  [facility] Procedure: Transesophageal Echo, Color Doppler, Cardiac Doppler and Saline            Contrast Bubble Study Indications:     Not listed on front sheet of order.  History:         Patient has prior history of Echocardiogram examinations, most                  recent 10/05/2019. No cardiac history listed.  Sonographer:     Cristela Blue RDCS (AE) Referring Phys:  [id number] Antonieta Iba Diagnosing Phys: Julien Nordmann MD PROCEDURE: After discussion of the risks and benefits of a TEE, an informed consent was obtained from the patient. TEE procedure time was 40 minutes. The transesophogeal probe was passed without difficulty through the esophogus of the patient. Imaged were obtained with the patient in a left lateral decubitus position. Local oropharyngeal anesthetic was provided with Cetacaine and viscous lidocaine. Sedation performed by performing physician. Patients was under conscious sedation during this  procedure. Anesthetic administered: of Fentanyl, 5.0mg  of Versed. Image quality was excellent. The patient's vital signs; including heart rate, blood pressure, and oxygen saturation; remained stable throughout the procedure. The patient developed no complications during the procedure. IMPRESSIONS  1. Left ventricular ejection fraction, by estimation, is 60 to 65%. The left ventricle has normal function. The left ventricle has no regional wall motion abnormalities. Left ventricular diastolic parameters are indeterminate.  2. Right ventricular systolic function is normal. The right ventricular size is normal.  3. No left atrial/left atrial appendage thrombus was detected.  4. The mitral valve is normal in structure and function. No evidence of mitral valve regurgitation. No evidence of mitral stenosis.  5. The aortic valve is normal in structure and function. Aortic valve regurgitation is trivial. No aortic stenosis is present.  6. There is mild (Grade II) plaque.  7. The inferior vena cava is normal in size with greater than 50% respiratory variability, suggesting right atrial pressure of 3 mmHg. Conclusion(s)/Recommendation(s): Normal biventricular function without evidence of hemodynamically significant valvular heart disease. FINDINGS  Left Ventricle: Left ventricular ejection fraction, by estimation, is 60 to 65%. The left ventricle  has normal function. The left ventricle has no regional wall motion abnormalities. The left ventricular internal cavity size was normal in size. There is  no left ventricular hypertrophy. Right Ventricle: The right ventricular size is normal. No increase in right ventricular wall thickness. Right ventricular systolic function is normal. Left Atrium: Left atrial size was normal in size. No left atrial/left atrial appendage thrombus was detected. Right Atrium: Right atrial size was normal in size. Pericardium: There is no evidence of pericardial effusion. Mitral Valve: The mitral  valve is normal in structure and function. Normal mobility of the mitral valve leaflets. No evidence of mitral valve regurgitation. No evidence of mitral valve stenosis. Tricuspid Valve: The tricuspid valve is normal in structure. Tricuspid valve regurgitation is mild . No evidence of tricuspid stenosis. Aortic Valve: The aortic valve is normal in structure and function. Aortic valve regurgitation is trivial. No aortic stenosis is present. Pulmonic Valve: The pulmonic valve was normal in structure. Pulmonic valve regurgitation is not visualized. No evidence of pulmonic stenosis. Aorta: The aortic root is normal in size and structure. There is mild (Grade II) plaque. Venous: The inferior vena cava is normal in size with greater than 50% respiratory variability, suggesting right atrial pressure of 3 mmHg. IAS/Shunts: No atrial level shunt detected by color flow Doppler. Agitated saline contrast was given intravenously to evaluate for intracardiac shunting. There is no evidence of a patent foramen ovale. There is no evidence of an atrial septal defect. Julien Nordmann MD Electronically signed by Julien Nordmann MD Signature Date/Time: 10/06/2019/1:14:03 PM    Final    ECHOCARDIOGRAM COMPLETE BUBBLE STUDY  Result Date: 10/05/2019    ECHOCARDIOGRAM REPORT   Patient Name:   MAYAR WHITTIER Date of Exam: 10/05/2019 Medical Rec #:  [id number]      Height:       62.0 in Accession #:    [id number]     Weight:       138.7 lb Date of Birth:  1956-07-24      BSA:          1.636 m Patient Age:    64 years       BP:           150/61 mmHg Patient Gender: F              HR:           75 bpm. Exam Location:  [facility] Procedure: 2D Echo, Color Doppler, Cardiac Doppler and Saline Contrast Bubble            Study Indications:     I163.9 Stroke  History:         Patient has no prior history of Echocardiogram examinations.                  Signs/Symptoms:Confusion. No medical history.  Sonographer:     Humphrey Rolls RDCS (AE) Referring Phys:   [id number] Earl Lites Hieu Herms Diagnosing Phys: Adrian Blackwater MD IMPRESSIONS  1. Left ventricular ejection fraction, by estimation, is 40 to 45%. The left ventricle has mildly decreased function. The left ventricle demonstrates regional wall motion abnormalities (see scoring diagram/findings for description). The left ventricular  internal cavity size was mildly dilated. Left ventricular diastolic parameters are consistent with Grade I diastolic dysfunction (impaired relaxation).  2. Right ventricular systolic function is normal. The right ventricular size is normal.  3. The mitral valve is normal in structure and function. Trivial mitral valve regurgitation. No evidence of mitral stenosis.  4. The aortic valve is normal in structure and function. Aortic valve regurgitation is not visualized. No aortic stenosis is present.  5. The inferior vena cava is normal in size with greater than 50% respiratory variability, suggesting right atrial pressure of 3 mmHg. FINDINGS  Left Ventricle: Left ventricular ejection fraction, by estimation, is 40 to 45%. The left ventricle has mildly decreased function. The left ventricle demonstrates regional wall motion abnormalities. The left ventricular internal cavity size was mildly dilated. There is no left ventricular hypertrophy. Left ventricular diastolic parameters are consistent with Grade I diastolic dysfunction (impaired relaxation).  LV Wall Scoring: The mid anteroseptal segment and mid inferoseptal segment are normal. Right Ventricle: The right ventricular size is normal. No increase in right ventricular wall thickness. Right ventricular systolic function is normal. Left Atrium: Left atrial size was normal in size. Right Atrium: Right atrial size was normal in size. Pericardium: There is no evidence of pericardial effusion. Mitral Valve: The mitral valve is normal in structure and function. Normal mobility of the mitral valve leaflets. Trivial mitral valve regurgitation. No  evidence of mitral valve stenosis. MV peak gradient, 2.6 mmHg. The mean mitral valve gradient is 1.0 mmHg. Tricuspid Valve: The tricuspid valve is normal in structure. Tricuspid valve regurgitation is trivial. No evidence of tricuspid stenosis. Aortic Valve: The aortic valve is normal in structure and function. Aortic valve regurgitation is not visualized. No aortic stenosis is present. Aortic valve mean gradient measures 3.0 mmHg. Aortic valve peak gradient measures 7.2 mmHg. Aortic valve area, by VTI measures 2.07 cm. Pulmonic Valve: The pulmonic valve was normal in structure. Pulmonic valve regurgitation is not visualized. No evidence of pulmonic stenosis. Aorta: The aortic root is normal in size and structure. Venous: The inferior vena cava is normal in size with greater than 50% respiratory variability, suggesting right atrial pressure of 3 mmHg. IAS/Shunts: No atrial level shunt detected by color flow Doppler. Agitated saline contrast was given intravenously to evaluate for intracardiac shunting.  LEFT VENTRICLE PLAX 2D LVIDd:         3.99 cm  Diastology LVIDs:         3.22 cm  LV e' lateral:   9.36 cm/s LV PW:         1.00 cm  LV E/e' lateral: 6.4 LV IVS:        0.86 cm  LV e' medial:    7.29 cm/s LVOT diam:     1.90 cm  LV E/e' medial:  8.2 LV SV:         55 LV SV Index:   34 LVOT Area:     2.84 cm  RIGHT VENTRICLE RV Basal diam:  2.76 cm LEFT ATRIUM             Index       RIGHT ATRIUM          Index LA diam:        3.40 cm 2.08 cm/m  RA Area:     9.13 cm LA Vol (A2C):   22.7 ml 13.87 ml/m RA Volume:   17.00 ml 10.39 ml/m LA Vol (A4C):   55.1 ml 33.68 ml/m LA Biplane Vol: 38.4 ml 23.47 ml/m  AORTIC VALVE                   PULMONIC VALVE AV Area (Vmax):    2.12 cm    PV Vmax:       1.05 m/s AV Area (Vmean):   2.10  cm    PV Vmean:      66.900 cm/s AV Area (VTI):     2.07 cm    PV VTI:        0.219 m AV Vmax:           134.00 cm/s PV Peak grad:  4.4 mmHg AV Vmean:          87.200 cm/s PV Mean grad:   2.0 mmHg AV VTI:            0.267 m AV Peak Grad:      7.2 mmHg AV Mean Grad:      3.0 mmHg LVOT Vmax:         100.00 cm/s LVOT Vmean:        64.700 cm/s LVOT VTI:          0.195 m LVOT/AV VTI ratio: 0.73  AORTA Ao Root diam: 2.60 cm MITRAL VALVE MV Area (PHT): 3.60 cm    SHUNTS MV Peak grad:  2.6 mmHg    Systemic VTI:  0.20 m MV Mean grad:  1.0 mmHg    Systemic Diam: 1.90 cm MV Vmax:       0.81 m/s MV Vmean:      57.1 cm/s MV Decel Time: 211 msec MV E velocity: 60.10 cm/s MV A velocity: 80.00 cm/s MV E/A ratio:  0.75 Adrian BlackwaterShaukat Khan MD Electronically signed by Adrian BlackwaterShaukat Khan MD Signature Date/Time: 10/05/2019/2:56:18 PM    Final        Subjective: Patient's had no new headache, loss of consciousness, decreased sensorium, confusion, vision changes, headache, seizures.  Discharge Exam: Vitals:   10/07/19 0412 10/07/19 0722  BP: (!) 123/50 115/60  Pulse: 70 65  Resp: 18 16  Temp: 98.3 F (36.8 C) 98.3 F (36.8 C)  SpO2: 94% 97%   Vitals:   10/06/19 2006 10/06/19 2358 10/07/19 0412 10/07/19 0722  BP: (!) 143/65 (!) 125/53 (!) 123/50 115/60  Pulse: 70 75 70 65  Resp: 18 18 18 16   Temp: 98.1 F (36.7 C) 98.3 F (36.8 C) 98.3 F (36.8 C) 98.3 F (36.8 C)  TempSrc: Oral Oral Oral Oral  SpO2: 98% 98% 94% 97%  Weight:      Height:        General: Pt is alert, awake, not in acute distress, sitting up in bed, interactive and pleasant. Cardiovascular: RRR, nl S1-S2, no murmurs appreciated.   No LE edema.   Respiratory: Normal respiratory rate and rhythm.  CTAB without rales or wheezes. Abdominal: Abdomen soft and non-tender.  No distension or HSM.   Neuro/Psych: Strength symmetric in upper and lower extremities.  Face symmetric, pupils equal round and reactive to light.  Strength seems mostly symmetric now.  She has dense expressive aphasia.  Judgment and insight appear normal.   The results of significant diagnostics from this hospitalization (including imaging, microbiology, ancillary and  laboratory) are listed below for reference.     Microbiology: Recent Results (from the past 240 hour(s))  Respiratory Panel by RT PCR (Flu A&B, Covid) - Nasopharyngeal Swab     Status: None   Collection Time: 10/05/19  4:19 AM   Specimen: Nasopharyngeal Swab  Result Value Ref Range Status   SARS Coronavirus 2 by RT PCR NEGATIVE NEGATIVE Final    Comment: (NOTE) SARS-CoV-2 target nucleic acids are NOT DETECTED. The SARS-CoV-2 RNA is generally detectable in upper respiratoy specimens during the acute phase of infection. The lowest concentration of SARS-CoV-2 viral copies this assay  can detect is 131 copies/mL. A negative result does not preclude SARS-Cov-2 infection and should not be used as the sole basis for treatment or other patient management decisions. A negative result may occur with  improper specimen collection/handling, submission of specimen other than nasopharyngeal swab, presence of viral mutation(s) within the areas targeted by this assay, and inadequate number of viral copies (<131 copies/mL). A negative result must be combined with clinical observations, patient history, and epidemiological information. The expected result is Negative. Fact Sheet for Patients:  https://www.moore.com/ Fact Sheet for Healthcare Providers:  https://www.young.biz/ This test is not yet ap proved or cleared by the [place] FDA and  has been authorized for detection and/or diagnosis of SARS-CoV-2 by FDA under an Emergency Use Authorization (EUA). This EUA will remain  in effect (meaning this test can be used) for the duration of the COVID-19 declaration under Section 564(b)(1) of the Act, 21 U.S.C. section 360bbb-3(b)(1), unless the authorization is terminated or revoked sooner.    Influenza A by PCR NEGATIVE NEGATIVE Final   Influenza B by PCR NEGATIVE NEGATIVE Final    Comment: (NOTE) The Xpert Xpress SARS-CoV-2/FLU/RSV assay is intended as an  aid in  the diagnosis of influenza from Nasopharyngeal swab specimens and  should not be used as a sole basis for treatment. Nasal washings and  aspirates are unacceptable for Xpert Xpress SARS-CoV-2/FLU/RSV  testing. Fact Sheet for Patients: https://www.moore.com/ Fact Sheet for Healthcare Providers: https://www.young.biz/ This test is not yet approved or cleared by the [place] FDA and  has been authorized for detection and/or diagnosis of SARS-CoV-2 by  FDA under an Emergency Use Authorization (EUA). This EUA will remain  in effect (meaning this test can be used) for the duration of the  Covid-19 declaration under Section 564(b)(1) of the Act, 21  U.S.C. section 360bbb-3(b)(1), unless the authorization is  terminated or revoked. Performed at [facility], [address] [place], [place] [address]   CULTURE, BLOOD (ROUTINE X 2) w Reflex to ID Panel     Status: None (Preliminary result)   Collection Time: 10/05/19  4:24 PM   Specimen: BLOOD  Result Value Ref Range Status   Specimen Description BLOOD RIGHT ANTECUBITAL  Final   Special Requests   Final    BOTTLES DRAWN AEROBIC AND ANAEROBIC Blood Culture adequate volume   Culture   Final    NO GROWTH 2 DAYS Performed at [facility], [address], [place], [place] [address]    Report Status PENDING  Incomplete  CULTURE, BLOOD (ROUTINE X 2) w Reflex to ID Panel     Status: None (Preliminary result)   Collection Time: 10/05/19  4:35 PM   Specimen: BLOOD  Result Value Ref Range Status   Specimen Description BLOOD BLOOD LEFT HAND  Final   Special Requests   Final    BOTTLES DRAWN AEROBIC ONLY Blood Culture adequate volume   Culture   Final    NO GROWTH 2 DAYS Performed at [facility], [address] [place], [place] [address]    Report Status PENDING  Incomplete     Labs: BNP (last 3 results) No results for input(s): BNP in the last 8760  hours. Basic Metabolic Panel: Recent Labs  Lab 10/05/19 0419 10/06/19 0525  NA 142 141  K 3.3* 4.2  CL 105 107  CO2 28 28  GLUCOSE 100* 94  BUN 11 8  CREATININE 0.67 0.66  CALCIUM 8.7* 8.7*  MG 2.0  --  Liver Function Tests: Recent Labs  Lab 10/05/19 0419  AST 16  ALT 13  ALKPHOS 81  BILITOT 0.4  PROT 7.5  ALBUMIN 3.9   No results for input(s): LIPASE, AMYLASE in the last 168 hours. No results for input(s): AMMONIA in the last 168 hours. CBC: Recent Labs  Lab 10/05/19 0419  WBC 7.8  NEUTROABS 5.3  HGB 13.4  HCT 40.3  MCV 85.0  PLT 292   Cardiac Enzymes: No results for input(s): CKTOTAL, CKMB, CKMBINDEX, TROPONINI in the last 168 hours. BNP: Invalid input(s): POCBNP CBG: No results for input(s): GLUCAP in the last 168 hours. D-Dimer No results for input(s): DDIMER in the last 72 hours. Hgb A1c Recent Labs    10/05/19 0419  HGBA1C 5.5   Lipid Profile Recent Labs    10/05/19 0419  CHOL 224*  HDL 52  LDLCALC 150*  TRIG 112  CHOLHDL 4.3   Thyroid function studies No results for input(s): TSH, T4TOTAL, T3FREE, THYROIDAB in the last 72 hours.  Invalid input(s): FREET3 Anemia work up No results for input(s): VITAMINB12, FOLATE, FERRITIN, TIBC, IRON, RETICCTPCT in the last 72 hours. Urinalysis    Component Value Date/Time   COLORURINE YELLOW (A) 10/05/2019 0419   APPEARANCEUR HAZY (A) 10/05/2019 0419   LABSPEC 1.019 10/05/2019 0419   PHURINE 5.0 10/05/2019 0419   GLUCOSEU NEGATIVE 10/05/2019 0419   HGBUR MODERATE (A) 10/05/2019 0419   BILIRUBINUR NEGATIVE 10/05/2019 0419   KETONESUR NEGATIVE 10/05/2019 0419   PROTEINUR NEGATIVE 10/05/2019 0419   NITRITE NEGATIVE 10/05/2019 0419   LEUKOCYTESUR NEGATIVE 10/05/2019 0419   Sepsis Labs Invalid input(s): PROCALCITONIN,  WBC,  LACTICIDVEN Microbiology Recent Results (from the past 240 hour(s))  Respiratory Panel by RT PCR (Flu A&B, Covid) - Nasopharyngeal Swab     Status: None   Collection  Time: 10/05/19  4:19 AM   Specimen: Nasopharyngeal Swab  Result Value Ref Range Status   SARS Coronavirus 2 by RT PCR NEGATIVE NEGATIVE Final    Comment: (NOTE) SARS-CoV-2 target nucleic acids are NOT DETECTED. The SARS-CoV-2 RNA is generally detectable in upper respiratoy specimens during the acute phase of infection. The lowest concentration of SARS-CoV-2 viral copies this assay can detect is 131 copies/mL. A negative result does not preclude SARS-Cov-2 infection and should not be used as the sole basis for treatment or other patient management decisions. A negative result may occur with  improper specimen collection/handling, submission of specimen other than nasopharyngeal swab, presence of viral mutation(s) within the areas targeted by this assay, and inadequate number of viral copies (<131 copies/mL). A negative result must be combined with clinical observations, patient history, and epidemiological information. The expected result is Negative. Fact Sheet for Patients:  https://www.moore.com/ Fact Sheet for Healthcare Providers:  https://www.young.biz/ This test is not yet ap proved or cleared by the [place] FDA and  has been authorized for detection and/or diagnosis of SARS-CoV-2 by FDA under an Emergency Use Authorization (EUA). This EUA will remain  in effect (meaning this test can be used) for the duration of the COVID-19 declaration under Section 564(b)(1) of the Act, 21 U.S.C. section 360bbb-3(b)(1), unless the authorization is terminated or revoked sooner.    Influenza A by PCR NEGATIVE NEGATIVE Final   Influenza B by PCR NEGATIVE NEGATIVE Final    Comment: (NOTE) The Xpert Xpress SARS-CoV-2/FLU/RSV assay is intended as an aid in  the diagnosis of influenza from Nasopharyngeal swab specimens and  should not be used as a sole basis for  treatment. Nasal washings and  aspirates are unacceptable for Xpert Xpress  SARS-CoV-2/FLU/RSV  testing. Fact Sheet for Patients: https://www.moore.com/https://www.fda.gov/media/142436/download Fact Sheet for Healthcare Providers: https://www.young.biz/https://www.fda.gov/media/142435/download This test is not yet approved or cleared by the [place] FDA and  has been authorized for detection and/or diagnosis of SARS-CoV-2 by  FDA under an Emergency Use Authorization (EUA). This EUA will remain  in effect (meaning this test can be used) for the duration of the  Covid-19 declaration under Section 564(b)(1) of the Act, 21  U.S.C. section 360bbb-3(b)(1), unless the authorization is  terminated or revoked. Performed at [facility], [address] [place], [place] [address]   CULTURE, BLOOD (ROUTINE X 2) w Reflex to ID Panel     Status: None (Preliminary result)   Collection Time: 10/05/19  4:24 PM   Specimen: BLOOD  Result Value Ref Range Status   Specimen Description BLOOD RIGHT ANTECUBITAL  Final   Special Requests   Final    BOTTLES DRAWN AEROBIC AND ANAEROBIC Blood Culture adequate volume   Culture   Final    NO GROWTH 2 DAYS Performed at [facility], [address] [place], [place] [address]    Report Status PENDING  Incomplete  CULTURE, BLOOD (ROUTINE X 2) w Reflex to ID Panel     Status: None (Preliminary result)   Collection Time: 10/05/19  4:35 PM   Specimen: BLOOD  Result Value Ref Range Status   Specimen Description BLOOD BLOOD LEFT HAND  Final   Special Requests   Final    BOTTLES DRAWN AEROBIC ONLY Blood Culture adequate volume   Culture   Final    NO GROWTH 2 DAYS Performed at [facility], [address] [place], [place] [address]    Report Status PENDING  Incomplete     Time coordinating discharge: 25 minutes      SIGNED:   Alberteen Samhristopher P Sayla Golonka, MD  [facility] 10/07/2019, 2:34 PM

## 2019-10-07 NOTE — TOC Transition Note (Signed)
Transition of Care [facility]) - CM/SW Discharge Note   Patient Details  Name: Robin Mosley MRN: [id number] Date of Birth: 06/29/1956  [facility]) CM/SW Contact:  Maud Deed, LCSW Phone Number:[phone] 10/07/2019, 9:57 AM   Clinical Narrative:    Pt medically stable for discharge. CSW notified daughter of discharge, Pt will be transported home by her brother. CSW notified Rosey Bath at [facility] of discharge.   Final next level of care: Home w Home Health Services Barriers to Discharge: No Barriers Identified   Patient Goals and CMS Choice Patient states their goals for this hospitalization and ongoing recovery are:: To go home      Discharge Placement                Patient to be transferred to facility by: Brother Tempie Hoist) Name of family member notified: Bridgette Moroz Patient and family notified of of transfer: 10/07/19  Discharge Plan and Services                DME Arranged: N/A         HH Arranged: Speech Therapy, OT HH Agency: [facility] (formerly [facility]) Date HH Agency Contacted: 10/07/19 Time HH Agency Contacted: [phone] Representative spoke with at [facility] Agency: Mellissa Kohut  Social Determinants of Health (SDOH) Interventions     Readmission Risk Interventions No flowsheet data found.

## 2019-10-07 NOTE — Progress Notes (Signed)
Patient discharged with instruction , stroke education , diet education and removed IV's without incident. Transported by W/c to brothers personal vehicle.

## 2019-10-07 NOTE — Progress Notes (Addendum)
Reason for consult: Stroke  Subjective: Patient is much more talkative today, answer simple questions and becomes involved in the conversation while I was speaking with her brother. Interestingly, brother tells me that she first started having right hand weakness approximately 1 week ago prior to this event.  Patient is walking without support.  She is being discharged home.   ROS: negative except above   Examination  Vital signs in last 24 hours: Temp:  [98.1 F (36.7 C)-98.3 F (36.8 C)] 98.3 F (36.8 C) (02/27 0722) Pulse Rate:  [65-76] 65 (02/27 0722) Resp:  [15-18] 16 (02/27 0722) BP: (115-147)/(50-81) 115/60 (02/27 0722) SpO2:  [94 %-98 %] 97 % (02/27 0722)  General: Well-developed, walking in the hallway CVS: pulse-normal rate and rhythm RS: breathing comfortably Extremities: normal   Neuro: MS: Patient is alert, speech is improved.  Able to state her name and month correctly, occasionally speaks in sentences.  When asked to repeat "today is a sunny day in [place]"-patient's responded " No, its not a sunny day" ( it wasn't sunny).  She appears to understand simple commands, but then also has difficulty some commands such as showing thumbs up, or with finger-to-nose testing> Mimics without difficulty. CN: pupils equal and reactive,  EOMI, face symmetric, tongue midline, normal sensation over face, Motor: 5/5 strength in all 4 extremities Reflexes: 2+ bilaterally over patella, biceps, plantars: flexor Coordination: normal Gait: Is walking without support  Basic Metabolic Panel: Recent Labs  Lab 10/05/19 0419 10/06/19 0525  NA 142 141  K 3.3* 4.2  CL 105 107  CO2 28 28  GLUCOSE 100* 94  BUN 11 8  CREATININE 0.67 0.66  CALCIUM 8.7* 8.7*  MG 2.0  --     CBC: Recent Labs  Lab 10/05/19 0419  WBC 7.8  NEUTROABS 5.3  HGB 13.4  HCT 40.3  MCV 85.0  PLT 292     Coagulation Studies: Recent Labs    10/05/19 0419  LABPROT 12.1  INR 0.9      ASSESSMENT AND PLAN 64 y.o.femalewith PMH of devrticulitis, IBS presents to the ED with altered mental status when family woke her up at 3AM to [place] to the airport-noted to be aphasic and have right-sided weakness.CT head already showed acute left MCA changes and MRI brain confirmed large infarct in the MCA territory. CT angiogram shows a left M2 inferior division occlusion and severe proximal P2 and P3 stenosis. CTP :The region of prolonged Tmax corresponds to the hypodensity on CT and diffusion abnormality on MRI, and no significant penumbra is present.  Not a candidate for TPA as she is outside the window and not can for LVO due to already established stroke with no penumbra. I suspect etiology of stroke is embolic,likely cardioembolic CT head repeated last night due to nurse concern of pupil asymmetry. CT Head showed stable 13mm midline shift.   Speaking to the brother today, patient apparently had been complaining of right hand weakness since about 1 week-sudden onset.    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 Mild stroke 1 week ago Hyperlipidemia Tobacco abuse   Risk factors: cigarette smoking, hyperlipidemia  Etiology:Embolic stroke of undetermined source (ESUS) vs intracranial atherosclerotic disease  Stroke workup included:  Imaging studies including vascular imaging :  #CT Head: Acute infarct affecting the superficial left temporal lobe andfrontal parietal junction, inferior division MCA territory.  No acute hemorrhage.  #CT Angiogram of head and neck: Left MCA M2 inferior division occlusion.Severe proximal right P2 and P3 stenoses.Widely patent cervical carotid and vertebral arteries.  #CT perfusion:The region of prolonged Tmax corresponds to the hypodensity on CT and diffusion abnormality on MRI no  significant penumbra is present #MRI Brain: Large acute infarct in the inferior division left MCA territory affecting temporal, posterior frontal, and parietal cortex.  #CT head repeated on 2/25 due to pupil asymmetry : A moderate to large left MCA vascular territory acute infarction is similar in extent when comparing with the MRI performed earlier today. No hemorrhagic conversion. Mild mass effect with unchanged 2 mm rightward midline shift.  #Transthoracic Echowith bubble study: negative for PFO, EF normal  # TEE: no thrombus, PFO, vegetation  # Blood cultures: negative  #Urine drug screen: THC # HBAIC and Lipid profile: LDL 150 # Telemetry monitoring : no atrial fibrillation  #Stroke swallow screen: passed #Speech therapy evaluation,PT and OT: home with therapy    Recommendations:  #Loop recorder to monitor for paroxysmal A. fib # Continue patient on ASA 325mg  daily on discharge #Start or continue Atorvastatin40 mg daily # BP goal: less than 140/90 mmHg # Frequent Neurochecks   Will need outpatient neurology follow up, preferably with stroke specialist.  Continue with physical and speech therapy Discussed in detail on the importance of getting insurance/applying for Medicaid.

## 2019-10-10 LAB — CULTURE, BLOOD (ROUTINE X 2)
Culture: NO GROWTH
Culture: NO GROWTH
Special Requests: ADEQUATE
Special Requests: ADEQUATE

## 2019-10-13 ENCOUNTER — Telehealth: Payer: Self-pay | Admitting: *Deleted

## 2019-10-13 NOTE — Telephone Encounter (Signed)
Nigel Sloop, RN  Jefferey Pica, RN   Cc: Robin Rising, RN  3/8 or 3/15 should work. On 3/15 he actually has some new patient slots open, so maybe that's an option? I [employer] as she is coordinating our LINQ supplies. If those dates don't work, I can keep looking!       Previous Messages   ----- Message -----  From: Jefferey Pica, RN  Sent: 10/10/2019  4:09 PM EST  To: Jefferey Pica, RN, Nigel Sloop, RN   Hello!   Just wanting your opinion on this. I have to set this patient up for a LINQ implant per discharge from [facility] for stroke.   SK told me to double book the patient in a morning slot on one of his [place] mornings.   What do you think would be a good day for all those that may need to be involved?

## 2019-10-13 NOTE — Telephone Encounter (Signed)
-----   Message from Antonieta Iba, MD sent at 10/06/2019  2:53 PM EST ----- Regarding: loop Wanted to drop a line to help facilitate loop monitor in this patient seen in the hospital October 06, 2019 for stroke.  She does have some Broca's aphasia and we may need daughter to help facilitate transportation morning of procedure. 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

## 2019-10-13 NOTE — Telephone Encounter (Signed)
Attempted to call the patient to arrange for a LINQ implant. No answer- I left a message to please call back.

## 2019-10-19 NOTE — Telephone Encounter (Signed)
Attempted to call the patient. No answer- I left a message to call back on her cell #.   I called and spoke with the patient's daughter, Hughie Closs. I advised I was trying to reach the patient. No DPR on file. I asked Bridgette if she could please speak with the patient and have her call us back. Per Bridgette, she will notify the patient to please call us back.

## 2019-10-19 NOTE — Telephone Encounter (Signed)
The patient's daughter called back and I was able to speak with the patient. The patient gave a verbal consent for me to speak with her daughter about her Medical Care.  I advised Bridgette that cardiology/ neurology from the patient's recent hospitalization was recommending a loop recorder implant to be done for the recent stroke the patient had.   I have discussed the process for LINQ implant and that will need to be done in our [place] office. They are agreeable with meeting with Dr. Graciela Husbands on Monday 3/15/21at 8:30 am for consultation/ implantation of her LINQ.  I have advised to please arrive 15 minutes prior. The patient's daughter will need to come with her as the patient is having some slight trouble with her communication.   I have advised: - light breakfast is ok - meds ok - wash chest/ neck with an antibacterial soap that morning.   Address for the [facility] office was provided.  Message sent to precert/ Dr. Laddie Aquas, RN in [facility] to notify them of the plan of care.

## 2019-10-23 ENCOUNTER — Other Ambulatory Visit: Payer: Self-pay

## 2019-10-23 ENCOUNTER — Encounter: Payer: Self-pay | Admitting: Internal Medicine

## 2019-10-23 ENCOUNTER — Ambulatory Visit (INDEPENDENT_AMBULATORY_CARE_PROVIDER_SITE_OTHER): Payer: Self-pay | Admitting: Internal Medicine

## 2019-10-23 VITALS — BP 162/88 | HR 89 | Ht 61.0 in | Wt 158.0 lb

## 2019-10-23 DIAGNOSIS — I639 Cerebral infarction, unspecified: Secondary | ICD-10-CM

## 2019-10-23 NOTE — Progress Notes (Signed)
ELECTROPHYSIOLOGY CONSULT NOTE  Patient ID: GEOVANNA SIMKO MRN: [id number], DOB/AGE: Aug 10, 1960   Admit date: (Not on file) Date of Consult: 10/23/2019  Primary Physician: Patient, No Pcp Per Primary Cardiologist: CE* Reason for Consultation: Cryptogenic stroke; recommendations regarding Implantable Loop Recorder  History of Present Illness  EP has been asked to evaluate Haydee Monica for placement of an implantable loop recorder to monitor for atrial fibrillation by Dr Mariah Milling.  The patient was admitted 2/21 with a left MCA ischemic stroke manifesting as aphasia and right arm weakness.  Significant interval improvement while in hospital.  Presented outside of the window of TPA.  CT angio confirmed left M2 occlusion also had stenosis of P2 and P3..   .  Imaging demonstrated As above    she has undergone workup for stroke including echocardiogram and carotid dopplers.  The patient has been monitored on telemetry which has demonstrated sinus rhythm with no arrhythmias.  Inpatient stroke work-up is to be completed with a transthoracic echo demonstrated an EF of 40-45%; interestingly, TEE demonstrated normal LV function    Interval recovery of some right hand function and speech but not back to baseline.    Past Medical History:  Diagnosis Date   Diverticulitis    IBS (irritable bowel syndrome)    Seasonal allergies      Surgical History:  Past Surgical History:  Procedure Laterality Date   CARPAL TUNNEL RELEASE     CESAREAN SECTION     CHOLECYSTECTOMY     TEE WITHOUT CARDIOVERSION N/A 10/06/2019   Procedure: TRANSESOPHAGEAL ECHOCARDIOGRAM (TEE);  Surgeon: Antonieta Iba, MD;  Location: [facility] ORS;  Service: Cardiovascular;  Laterality: N/A;       Allergies:  Allergies  Allergen Reactions   Sulfa Antibiotics Hives   Tetracyclines & Related Hives    Social History   Socioeconomic History   Marital status: Widowed    Spouse name: Not on file   Number of  children: Not on file   Years of education: Not on file   Highest education level: Not on file  Occupational History   Not on file  Tobacco Use   Smoking status: Current Every Day Smoker    Types: Cigarettes   Smokeless tobacco: Never Used  Substance and Sexual Activity   Alcohol use: Yes    Comment: social   Drug use: No   Sexual activity: Not on file  Other Topics Concern   Not on file  Social History Narrative   Not on file   Social Determinants of Health   Financial Resource Strain:    Difficulty of Paying Living Expenses:   Food Insecurity:    Worried About Programme researcher, broadcasting/film/video in the Last Year:    Barista in the Last Year:   Transportation Needs:    Freight forwarder (Medical):    Lack of Transportation (Non-Medical):   Physical Activity:    Days of Exercise per Week:    Minutes of Exercise per Session:   Stress:    Feeling of Stress :   Social Connections:    Frequency of Communication with Friends and Family:    Frequency of Social Gatherings with Friends and Family:    Attends Religious Services:    Active Member of Clubs or Organizations:    Attends Banker Meetings:    Marital Status:   Intimate Partner Violence:    Fear of Current or Ex-Partner:    Emotionally Abused:  Physically Abused:    Sexually Abused:      Family History  Problem Relation Age of Onset   Heart disease Mother       Review of Systems: All other systems reviewed and are otherwise negative except as noted above.  Physical Exam: Vitals:   10/23/19 0843  BP: (!) 162/88  Pulse: 89  SpO2: 97%  Weight: 158 lb (71.7 kg)  Height: 5\' 1"  (1.549 m)   BP (!) 162/88    Pulse 89    Ht 5\' 1"  (1.549 m)    Wt 158 lb (71.7 kg)    SpO2 97%    BMI 29.85 kg/m  Well developed and nourished in no acute distress HENT normal Neck supple with JVP  Clear Regular rate and rhythm, no murmurs or gallops Abd-soft with active BS No  Clubbing cyanosis edema Skin-warm and dry A & Oriented  Grossly normal sensory impaired right hand function; ongoing apraxia   Labs:   Lab Results  Component Value Date   WBC 7.8 10/05/2019   HGB 13.4 10/05/2019   HCT 40.3 10/05/2019   MCV 85.0 10/05/2019   PLT 292 10/05/2019   No results for input(s): NA, K, CL, CO2, BUN, CREATININE, CALCIUM, PROT, BILITOT, ALKPHOS, ALT, AST, GLUCOSE in the last 168 hours.  Invalid input(s): LABALBU   Radiology/Studies: CT ANGIO HEAD W OR WO CONTRAST  Result Date: 10/05/2019 CLINICAL DATA:  Acute left MCA infarct. Confusion, expressive aphasia, and right-sided neglect. EXAM: CT ANGIOGRAPHY HEAD AND NECK CT PERFUSION BRAIN TECHNIQUE: Multidetector CT imaging of the head and neck was performed using the standard protocol during bolus administration of intravenous contrast. Multiplanar CT image reconstructions and MIPs were obtained to evaluate the vascular anatomy. Carotid stenosis measurements (when applicable) are obtained utilizing NASCET criteria, using the distal internal carotid diameter as the denominator. Multiphase CT imaging of the brain was performed following IV bolus contrast injection. Subsequent parametric perfusion maps were calculated using RAPID software. CONTRAST:  124mL OMNIPAQUE IOHEXOL 350 MG/ML SOLN COMPARISON:  Head CT and MRI 10/05/2019 FINDINGS: CT HEAD FINDINGS Brain: A moderate-sized acute left MCA territory infarct is again seen involving the left temporal, parietal, and posterior frontal lobes. Cytotoxic edema has mildly increased compared to today's earlier CT, and the distribution of the infarct is unchanged from the MRI. No acute intracranial hemorrhage, midline shift, or extra-axial fluid collection is identified. The ventricles are normal in size. Vascular: Calcified atherosclerosis at the skull base. Skull: No fracture or suspicious osseous lesion. Sinuses/Orbits: Paranasal sinuses and mastoid air cells are clear. Unremarkable  orbits. Other: None. ASPECTS [facility] Stroke Program Early CT Score) - Ganglionic level infarction (caudate, lentiform nuclei, internal capsule, insula, M1-M3 cortex): 5 - Supraganglionic infarction (M4-M6 cortex): 2 Total score (0-10 with 10 being normal): 7 Review of the MIP images confirms the above findings CTA NECK FINDINGS Aortic arch: Standard 3 vessel aortic arch with widely patent arch vessel origins. Right carotid system: Patent with minimal atherosclerotic plaque at the carotid bifurcation. No evidence of stenosis or dissection. Left carotid system: Patent without evidence of stenosis or dissection. Vertebral arteries: Patent and codominant without evidence of stenosis or dissection. Skeleton: Moderately large anterior vertebral osteophytes at C5-6 and C6-7. Other neck: No evidence of cervical lymphadenopathy or mass. Upper chest: Clear lung apices. Review of the MIP images confirms the above findings CTA HEAD FINDINGS Anterior circulation: The internal carotid arteries are patent from skull base to carotid termini with mild atherosclerotic plaque bilaterally not resulting in  significant stenosis. The M1 segments are widely patent bilaterally, however there is occlusion of the left M2 inferior division near its origin. ACAs are patent with an azygos A2 configuration and no significant proximal stenosis. No aneurysm is identified. Posterior circulation: The intracranial vertebral arteries are widely patent to the basilar. Patent left PICA, right AICA, and bilateral SCA is are seen. The basilar artery is widely patent. Posterior communicating arteries are diminutive or absent. Both PCAs are patent without evidence of significant stenosis on the left. There are severe proximal P2 and P3 stenoses on the right. No aneurysm is identified. Venous sinuses: As permitted by contrast timing, patent. Anatomic variants: Azygos A2. Review of the MIP images confirms the above findings CT Brain Perfusion Findings:  ASPECTS: 7 CBF (<30%) Volume: 8mL, however a core infarct is evident on CT and MRI Perfusion (Tmax>6.0s) volume: 63mL Mismatch Volume: The region of prolonged Tmax corresponds to the hypodensity on CT and diffusion abnormality on MRI, and no significant penumbra is present Infarction Location: Left MCA inferior division IMPRESSION: 1. Left MCA M2 inferior division occlusion. 2. Associated moderate-sized acute left MCA infarct without significant penumbra. 3. 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 4. Widely patent cervical carotid and vertebral arteries. These results were communicated to Dr. Laurence Slate at 9:30 am on 10/05/2019 by text page via the [facility] messaging system. Electronically Signed   By: Sebastian Ache M.D.   On: 10/05/2019 09:46   CT HEAD WO CONTRAST  Result Date: 10/05/2019 CLINICAL DATA:  Neuro deficit, subacute. EXAM: CT HEAD WITHOUT CONTRAST TECHNIQUE: Contiguous axial images were obtained from the base of the skull through the vertex without intravenous contrast. COMPARISON:  CT angiogram head/neck and CT perfusion 10/05/2019, brain MRI 10/05/2019. FINDINGS: Brain: Again demonstrated is a moderate to large acute cortical/subcortical infarct within the left MCA vascular territory predominantly involving the left parietal and temporal lobes, but also involving portions of the posterior left frontal lobe. The infarct is similar in extent as compared to the MRI performed earlier today. No evidence of hemorrhagic conversion. Mild mass effect with 2 mm rightward midline shift, similar to prior MRI. No hydrocephalus or extra-axial fluid collection. Cerebral volume is normal for age. Vascular: A left M2 proximal branch occlusion was demonstrated on CTA head performed earlier today. No abnormal vascular hyperdensity identified elsewhere. Skull: No calvarial fracture or suspicious osseous lesion. Sinuses/Orbits: A moderate IMPRESSION: A moderate to large left MCA vascular territory acute infarction is similar  in extent when comparing with the MRI performed earlier today. No hemorrhagic conversion. Mild mass effect with unchanged 2 mm rightward midline shift. Electronically Signed   By: Jackey Loge DO   On: 10/05/2019 19:36   CT HEAD WO CONTRAST  Result Date: 10/05/2019 CLINICAL DATA:  Confusion.  Right-sided weakness. EXAM: CT HEAD WITHOUT CONTRAST TECHNIQUE: Contiguous axial images were obtained from the base of the skull through the vertex without intravenous contrast. COMPARISON:  None. FINDINGS: Brain: Sizable area of cytotoxic edema in the superficial left temporal lobe and even more defined in the left frontal parietal region, inferior division MCA territory. No acute hemorrhage. No hydrocephalus or shift. Elsewhere the brain has a normal appearance Vascular: No hyperdense vessel. Skull: Normal Sinuses/Orbits: Normal These results were called by telephone at the time of interpretation on 10/05/2019 at 4:38 am to provider [facility] , who verbally acknowledged these results. IMPRESSION: Acute infarct affecting the superficial left temporal lobe and frontal parietal junction, inferior division MCA territory. No acute hemorrhage. Electronically Signed  By: Marnee Spring M.D.   On: 10/05/2019 04:38   CT ANGIO NECK W OR WO CONTRAST  Result Date: 10/05/2019 CLINICAL DATA:  Acute left MCA infarct. Confusion, expressive aphasia, and right-sided neglect. EXAM: CT ANGIOGRAPHY HEAD AND NECK CT PERFUSION BRAIN TECHNIQUE: Multidetector CT imaging of the head and neck was performed using the standard protocol during bolus administration of intravenous contrast. Multiplanar CT image reconstructions and MIPs were obtained to evaluate the vascular anatomy. Carotid stenosis measurements (when applicable) are obtained utilizing NASCET criteria, using the distal internal carotid diameter as the denominator. Multiphase CT imaging of the brain was performed following IV bolus contrast injection. Subsequent parametric  perfusion maps were calculated using RAPID software. CONTRAST:  OMNIPAQUE IOHEXOL 350 MG/ML SOLN COMPARISON:  Head CT and MRI 10/05/2019 FINDINGS: CT HEAD FINDINGS Brain: A moderate-sized acute left MCA territory infarct is again seen involving the left temporal, parietal, and posterior frontal lobes. Cytotoxic edema has mildly increased compared to today's earlier CT, and the distribution of the infarct is unchanged from the MRI. No acute intracranial hemorrhage, midline shift, or extra-axial fluid collection is identified. The ventricles are normal in size. Vascular: Calcified atherosclerosis at the skull base. Skull: No fracture or suspicious osseous lesion. Sinuses/Orbits: Paranasal sinuses and mastoid air cells are clear. Unremarkable orbits. Other: None. ASPECTS [facility] Stroke Program Early CT Score) - Ganglionic level infarction (caudate, lentiform nuclei, internal capsule, insula, M1-M3 cortex): 5 - Supraganglionic infarction (M4-M6 cortex): 2 Total score (0-10 with 10 being normal): 7 Review of the MIP images confirms the above findings CTA NECK FINDINGS Aortic arch: Standard 3 vessel aortic arch with widely patent arch vessel origins. Right carotid system: Patent with minimal atherosclerotic plaque at the carotid bifurcation. No evidence of stenosis or dissection. Left carotid system: Patent without evidence of stenosis or dissection. Vertebral arteries: Patent and codominant without evidence of stenosis or dissection. Skeleton: Moderately large anterior vertebral osteophytes at C5-6 and C6-7. Other neck: No evidence of cervical lymphadenopathy or mass. Upper chest: Clear lung apices. Review of the MIP images confirms the above findings CTA HEAD FINDINGS Anterior circulation: The internal carotid arteries are patent from skull base to carotid termini with mild atherosclerotic plaque bilaterally not resulting in significant stenosis. The M1 segments are widely patent bilaterally, however there is  occlusion of the left M2 inferior division near its origin. ACAs are patent with an azygos A2 configuration and no significant proximal stenosis. No aneurysm is identified. Posterior circulation: The intracranial vertebral arteries are widely patent to the basilar. Patent left PICA, right AICA, and bilateral SCA is are seen. The basilar artery is widely patent. Posterior communicating arteries are diminutive or absent. Both PCAs are patent without evidence of significant stenosis on the left. There are severe proximal P2 and P3 stenoses on the right. No aneurysm is identified. Venous sinuses: As permitted by contrast timing, patent. Anatomic variants: Azygos A2. Review of the MIP images confirms the above findings CT Brain Perfusion Findings: ASPECTS: 7 CBF (<30%) Volume: 46mL, however a core infarct is evident on CT and MRI Perfusion (Tmax>6.0s) volume: 65mL Mismatch Volume: The region of prolonged Tmax corresponds to the hypodensity on CT and diffusion abnormality on MRI, and no significant penumbra is present Infarction Location: Left MCA inferior division IMPRESSION: 1. Left MCA M2 inferior division occlusion. 2. Associated moderate-sized acute left MCA infarct without significant penumbra. 3. 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 4. Widely patent cervical carotid and vertebral arteries. These results were communicated to Dr.  Aroor at 9:30 am on 10/05/2019 by text page via the [facility] messaging system. Electronically Signed   By: Sebastian AcheAllen  Grady M.D.   On: 10/05/2019 09:46   MR BRAIN WO CONTRAST  Result Date: 10/05/2019 CLINICAL DATA:  Ataxia with stroke suspected EXAM: MRI HEAD WITHOUT CONTRAST TECHNIQUE: Multiplanar, multiecho pulse sequences of the brain and surrounding structures were obtained without intravenous contrast. COMPARISON:  Head CT from earlier today FINDINGS: Brain: Large area of restricted diffusion in the superficial left temporal cortex and more patchy in the posterior left frontal and  parietal cortex. The more inferior infarct has a somewhat more hazy appearance. Based on CT the infarcts may be of slightly different ages, although both acute. No acute hemorrhage, hydrocephalus, or masslike finding. No pre-existing infarct affecting the cortex. Small vessel type changes are minimal and less than expected for age. Brain volume is normal Vascular: Normal proximal flow voids. Skull and upper cervical spine: Normal marrow signal Sinuses/Orbits: Negative IMPRESSION: 1. Large acute infarct in the inferior division left MCA territory affecting temporal, posterior frontal, and parietal cortex. 2. No hemorrhagic conversion.  No pre-existing infarct. Electronically Signed   By: Marnee SpringJonathon  Watts M.D.   On: 10/05/2019 06:49   CT CEREBRAL PERFUSION W CONTRAST  Result Date: 10/05/2019 CLINICAL DATA:  Acute left MCA infarct. Confusion, expressive aphasia, and right-sided neglect. EXAM: CT ANGIOGRAPHY HEAD AND NECK CT PERFUSION BRAIN TECHNIQUE: Multidetector CT imaging of the head and neck was performed using the standard protocol during bolus administration of intravenous contrast. Multiplanar CT image reconstructions and MIPs were obtained to evaluate the vascular anatomy. Carotid stenosis measurements (when applicable) are obtained utilizing NASCET criteria, using the distal internal carotid diameter as the denominator. Multiphase CT imaging of the brain was performed following IV bolus contrast injection. Subsequent parametric perfusion maps were calculated using RAPID software. CONTRAST:  100mL OMNIPAQUE IOHEXOL 350 MG/ML SOLN COMPARISON:  Head CT and MRI 10/05/2019 FINDINGS: CT HEAD FINDINGS Brain: A moderate-sized acute left MCA territory infarct is again seen involving the left temporal, parietal, and posterior frontal lobes. Cytotoxic edema has mildly increased compared to today's earlier CT, and the distribution of the infarct is unchanged from the MRI. No acute intracranial hemorrhage, midline  shift, or extra-axial fluid collection is identified. The ventricles are normal in size. Vascular: Calcified atherosclerosis at the skull base. Skull: No fracture or suspicious osseous lesion. Sinuses/Orbits: Paranasal sinuses and mastoid air cells are clear. Unremarkable orbits. Other: None. ASPECTS [facility] Stroke Program Early CT Score) - Ganglionic level infarction (caudate, lentiform nuclei, internal capsule, insula, M1-M3 cortex): 5 - Supraganglionic infarction (M4-M6 cortex): 2 Total score (0-10 with 10 being normal): 7 Review of the MIP images confirms the above findings CTA NECK FINDINGS Aortic arch: Standard 3 vessel aortic arch with widely patent arch vessel origins. Right carotid system: Patent with minimal atherosclerotic plaque at the carotid bifurcation. No evidence of stenosis or dissection. Left carotid system: Patent without evidence of stenosis or dissection. Vertebral arteries: Patent and codominant without evidence of stenosis or dissection. Skeleton: Moderately large anterior vertebral osteophytes at C5-6 and C6-7. Other neck: No evidence of cervical lymphadenopathy or mass. Upper chest: Clear lung apices. Review of the MIP images confirms the above findings CTA HEAD FINDINGS Anterior circulation: The internal carotid arteries are patent from skull base to carotid termini with mild atherosclerotic plaque bilaterally not resulting in significant stenosis. The M1 segments are widely patent bilaterally, however there is occlusion of the left M2 inferior division near its origin. ACAs  are patent with an azygos A2 configuration and no significant proximal stenosis. No aneurysm is identified. Posterior circulation: The intracranial vertebral arteries are widely patent to the basilar. Patent left PICA, right AICA, and bilateral SCA is are seen. The basilar artery is widely patent. Posterior communicating arteries are diminutive or absent. Both PCAs are patent without evidence of significant stenosis  on the left. There are severe proximal P2 and P3 stenoses on the right. No aneurysm is identified. Venous sinuses: As permitted by contrast timing, patent. Anatomic variants: Azygos A2. Review of the MIP images confirms the above findings CT Brain Perfusion Findings: ASPECTS: 7 CBF (<30%) Volume: 0mL, however a core infarct is evident on CT and MRI Perfusion (Tmax>6.0s) volume: 29mL Mismatch Volume: The region of prolonged Tmax corresponds to the hypodensity on CT and diffusion abnormality on MRI, and no significant penumbra is present Infarction Location: Left MCA inferior division IMPRESSION: 1. Left MCA M2 inferior division occlusion. 2. Associated moderate-sized acute left MCA infarct without significant penumbra. 3. 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 4. Widely patent cervical carotid and vertebral arteries. These results were communicated to Dr. Laurence Slate at 9:30 am on 10/05/2019 by text page via the [facility] messaging system. Electronically Signed   By: Sebastian Ache M.D.   On: 10/05/2019 09:46   ECHO TEE  Result Date: 10/06/2019    TRANSESOPHOGEAL ECHO REPORT   Patient Name:   ZANIYA MCAULAY Date of Exam: 10/06/2019 Medical Rec #:  [id number]      Height:       62.0 in Accession #:    [id number]     Weight:       138.7 lb Date of Birth:  [birth year]      BSA:          1.636 m Patient Age:    64 years       BP:           131/66 mmHg Patient Gender: F              HR:           67 bpm. Exam Location:  [facility] Procedure: Transesophageal Echo, Color Doppler, Cardiac Doppler and Saline            Contrast Bubble Study Indications:     Not listed on front sheet of order.  History:         Patient has prior history of Echocardiogram examinations, most                  recent 10/05/2019. No cardiac history listed.  Sonographer:     Cristela Blue RDCS (AE) Referring Phys:  [id number] Antonieta Iba Diagnosing Phys: Julien Nordmann MD PROCEDURE: After discussion of the risks and benefits of a TEE, an informed consent was obtained  from the patient. TEE procedure time was 40 minutes. The transesophogeal probe was passed without difficulty through the esophogus of the patient. Imaged were obtained with the patient in a left lateral decubitus position. Local oropharyngeal anesthetic was provided with Cetacaine and viscous lidocaine. Sedation performed by performing physician. Patients was under conscious sedation during this procedure. Anesthetic administered: of Fentanyl, 5.0mg  of Versed. Image quality was excellent. The patient's vital signs; including heart rate, blood pressure, and oxygen saturation; remained stable throughout the procedure. The patient developed no complications during the procedure. IMPRESSIONS  1. Left ventricular ejection fraction, by estimation, is 60 to 65%. The left ventricle has normal function. The left ventricle  has no regional wall motion abnormalities. Left ventricular diastolic parameters are indeterminate.  2. Right ventricular systolic function is normal. The right ventricular size is normal.  3. No left atrial/left atrial appendage thrombus was detected.  4. The mitral valve is normal in structure and function. No evidence of mitral valve regurgitation. No evidence of mitral stenosis.  5. The aortic valve is normal in structure and function. Aortic valve regurgitation is trivial. No aortic stenosis is present.  6. There is mild (Grade II) plaque.  7. The inferior vena cava is normal in size with greater than 50% respiratory variability, suggesting right atrial pressure of 3 mmHg. Conclusion(s)/Recommendation(s): Normal biventricular function without evidence of hemodynamically significant valvular heart disease. FINDINGS  Left Ventricle: Left ventricular ejection fraction, by estimation, is 60 to 65%. The left ventricle has normal function. The left ventricle has no regional wall motion abnormalities. The left ventricular internal cavity size was normal in size. There is  no left ventricular  hypertrophy. Right Ventricle: The right ventricular size is normal. No increase in right ventricular wall thickness. Right ventricular systolic function is normal. Left Atrium: Left atrial size was normal in size. No left atrial/left atrial appendage thrombus was detected. Right Atrium: Right atrial size was normal in size. Pericardium: There is no evidence of pericardial effusion. Mitral Valve: The mitral valve is normal in structure and function. Normal mobility of the mitral valve leaflets. No evidence of mitral valve regurgitation. No evidence of mitral valve stenosis. Tricuspid Valve: The tricuspid valve is normal in structure. Tricuspid valve regurgitation is mild . No evidence of tricuspid stenosis. Aortic Valve: The aortic valve is normal in structure and function. Aortic valve regurgitation is trivial. No aortic stenosis is present. Pulmonic Valve: The pulmonic valve was normal in structure. Pulmonic valve regurgitation is not visualized. No evidence of pulmonic stenosis. Aorta: The aortic root is normal in size and structure. There is mild (Grade II) plaque. Venous: The inferior vena cava is normal in size with greater than 50% respiratory variability, suggesting right atrial pressure of 3 mmHg. IAS/Shunts: No atrial level shunt detected by color flow Doppler. Agitated saline contrast was given intravenously to evaluate for intracardiac shunting. There is no evidence of a patent foramen ovale. There is no evidence of an atrial septal defect. Julien Nordmann MD Electronically signed by Julien Nordmann MD Signature Date/Time: 10/06/2019/1:14:03 PM    Final    ECHOCARDIOGRAM COMPLETE BUBBLE STUDY  Result Date: 10/05/2019    ECHOCARDIOGRAM REPORT   Patient Name:   MASHA ORBACH Date of Exam: 10/05/2019 Medical Rec #:  [id number]      Height:       62.0 in Accession #:    [id number]     Weight:       138.7 lb Date of Birth:  [birth year]      BSA:          1.636 m Patient Age:    64 years       BP:            150/61 mmHg Patient Gender: F              HR:           75 bpm. Exam Location:  [facility] Procedure: 2D Echo, Color Doppler, Cardiac Doppler and Saline Contrast Bubble            Study Indications:     I163.9 Stroke  History:         Patient has  no prior history of Echocardiogram examinations.                  Signs/Symptoms:Confusion. No medical history.  Sonographer:     Humphrey Rolls RDCS (AE) Referring Phys:  [id number] Earl Lites DANFORD Diagnosing Phys: Adrian Blackwater MD IMPRESSIONS  1. Left ventricular ejection fraction, by estimation, is 40 to 45%. The left ventricle has mildly decreased function. The left ventricle demonstrates regional wall motion abnormalities (see scoring diagram/findings for description). The left ventricular  internal cavity size was mildly dilated. Left ventricular diastolic parameters are consistent with Grade I diastolic dysfunction (impaired relaxation).  2. Right ventricular systolic function is normal. The right ventricular size is normal.  3. The mitral valve is normal in structure and function. Trivial mitral valve regurgitation. No evidence of mitral stenosis.  4. The aortic valve is normal in structure and function. Aortic valve regurgitation is not visualized. No aortic stenosis is present.  5. The inferior vena cava is normal in size with greater than 50% respiratory variability, suggesting right atrial pressure of 3 mmHg. FINDINGS  Left Ventricle: Left ventricular ejection fraction, by estimation, is 40 to 45%. The left ventricle has mildly decreased function. The left ventricle demonstrates regional wall motion abnormalities. The left ventricular internal cavity size was mildly dilated. There is no left ventricular hypertrophy. Left ventricular diastolic parameters are consistent with Grade I diastolic dysfunction (impaired relaxation).  LV Wall Scoring: The mid anteroseptal segment and mid inferoseptal segment are normal. Right Ventricle: The right ventricular size is normal. No  increase in right ventricular wall thickness. Right ventricular systolic function is normal. Left Atrium: Left atrial size was normal in size. Right Atrium: Right atrial size was normal in size. Pericardium: There is no evidence of pericardial effusion. Mitral Valve: The mitral valve is normal in structure and function. Normal mobility of the mitral valve leaflets. Trivial mitral valve regurgitation. No evidence of mitral valve stenosis. MV peak gradient, 2.6 mmHg. The mean mitral valve gradient is 1.0 mmHg. Tricuspid Valve: The tricuspid valve is normal in structure. Tricuspid valve regurgitation is trivial. No evidence of tricuspid stenosis. Aortic Valve: The aortic valve is normal in structure and function. Aortic valve regurgitation is not visualized. No aortic stenosis is present. Aortic valve mean gradient measures 3.0 mmHg. Aortic valve peak gradient measures 7.2 mmHg. Aortic valve area, by VTI measures 2.07 cm. Pulmonic Valve: The pulmonic valve was normal in structure. Pulmonic valve regurgitation is not visualized. No evidence of pulmonic stenosis. Aorta: The aortic root is normal in size and structure. Venous: The inferior vena cava is normal in size with greater than 50% respiratory variability, suggesting right atrial pressure of 3 mmHg. IAS/Shunts: No atrial level shunt detected by color flow Doppler. Agitated saline contrast was given intravenously to evaluate for intracardiac shunting.  LEFT VENTRICLE PLAX 2D LVIDd:         3.99 cm  Diastology LVIDs:         3.22 cm  LV e' lateral:   9.36 cm/s LV PW:         1.00 cm  LV E/e' lateral: 6.4 LV IVS:        0.86 cm  LV e' medial:    7.29 cm/s LVOT diam:     1.90 cm  LV E/e' medial:  8.2 LV SV:         55 LV SV Index:   34 LVOT Area:     2.84 cm  RIGHT VENTRICLE RV Basal diam:  2.76 cm LEFT ATRIUM             Index       RIGHT ATRIUM          Index LA diam:        3.40 cm 2.08 cm/m  RA Area:     9.13 cm LA Vol (A2C):   22.7 ml 13.87 ml/m RA Volume:    17.00 ml 10.39 ml/m LA Vol (A4C):   55.1 ml 33.68 ml/m LA Biplane Vol: 38.4 ml 23.47 ml/m  AORTIC VALVE                   PULMONIC VALVE AV Area (Vmax):    2.12 cm    PV Vmax:       1.05 m/s AV Area (Vmean):   2.10 cm    PV Vmean:      66.900 cm/s AV Area (VTI):     2.07 cm    PV VTI:        0.219 m AV Vmax:           134.00 cm/s PV Peak grad:  4.4 mmHg AV Vmean:          87.200 cm/s PV Mean grad:  2.0 mmHg AV VTI:            0.267 m AV Peak Grad:      7.2 mmHg AV Mean Grad:      3.0 mmHg LVOT Vmax:         100.00 cm/s LVOT Vmean:        64.700 cm/s LVOT VTI:          0.195 m LVOT/AV VTI ratio: 0.73  AORTA Ao Root diam: 2.60 cm MITRAL VALVE MV Area (PHT): 3.60 cm    SHUNTS MV Peak grad:  2.6 mmHg    Systemic VTI:  0.20 m MV Mean grad:  1.0 mmHg    Systemic Diam: 1.90 cm MV Vmax:       0.81 m/s MV Vmean:      57.1 cm/s MV Decel Time: 211 msec MV E velocity: 60.10 cm/s MV A velocity: 80.00 cm/s MV E/A ratio:  0.75 Adrian Blackwater MD Electronically signed by Adrian Blackwater MD Signature Date/Time: 10/05/2019/2:56:18 PM    Final     12-lead ECG 2/21 Personally reviewed  Sinus @ 7818/10/39 Telemetry *sinus  (personally reviewed)  Assessment and Plan:  1. Cryptogenic stroke The patient presents with cryptogenic stroke.  The patient has a TEE planned for this AM.  I spoke at length with the patient about monitoring for afib with an implantable loop recorder.  Risks, benefits, and alteratives to implantable loop recorder were discussed with the patient today.   At this time, the patient is very clear in their decision to proceed with implantable loop recorder.   Pre op Dx Cryptogenic Stroke  Post op Dx Same  Procedure  Loop Recorder implantation  After routine prep and drape of the left parasternal area, a small incision was created. A [employer] LINQ Reveal Loop Recorder  Serial Number  [id number] was inserted.    SteriStrip dressing was  applied.  The patient tolerated the procedure without apparent  complication.  EBL < 10ccq   Wound care was reviewed with the patient (keep incision clean and dry for 3 days).  Wound check scheduled and entered in AVS.  Please call with questions.   Sherryl Manges, MD 10/23/2019 8:47 AM

## 2019-10-23 NOTE — Patient Instructions (Signed)
Medication Instructions:  Your physician recommends that you continue on your current medications as directed. Please refer to the Current Medication list given to you today.  Labwork: None ordered.  Testing/Procedures: None ordered.  Follow-Up: Your physician wants you to follow-up: Virtual wound check 11/07/2019 at 930am.  You will follow up with Dr Graciela Husbands as needed.   Remote monitoring is used to monitor your Pacemaker of ICD from home. This monitoring reduces the number of office visits required to check your device to one time per year. It allows [place] to keep an eye on the functioning of your device to ensure it is working properly.   Any Other Special Instructions Will Be Listed Below (If Applicable).   Wound Care, Adult Taking care of your wound properly can help to prevent pain, infection, and scarring. It can also help your wound to heal more quickly. How to care for your wound Wound care      Follow instructions from your health care provider about how to take care of your wound. Make sure you: ? Wash your hands with soap and water before you change the bandage (dressing). If soap and water are not available, use hand sanitizer. ? Change your dressing as told by your health care provider. ? Leave stitches (sutures), skin glue, or adhesive strips in place. These skin closures may need to stay in place for 2 weeks or longer. If adhesive strip edges start to loosen and curl up, you may trim the loose edges. Do not remove adhesive strips completely unless your health care provider tells you to do that.  Check your wound area every day for signs of infection. Check for: ? Redness, swelling, or pain. ? Fluid or blood. ? Warmth. ? Pus or a bad smell.  Ask your health care provider if you should clean the wound with mild soap and water. Doing this may include: ? Using a clean towel to pat the wound dry after cleaning it. Do not rub or scrub the wound. ? Covering the incision with  a clean dressing.  Ask your health care provider when you can leave the wound uncovered. You may remove large dressing in 4 days. 10/27/2019  Keep the dressing dry until your health care provider says it can be removed. Do not take baths, swim, use a hot tub, or do anything that would put the wound underwater until your health care provider approves. Ask your health care provider if you can take showers. You may only be allowed to take sponge baths.  You may shower tomorrow 10/24/2019.  Medicines Take over-the-counter and prescription medicines only as told by your health care provider. If you were prescribed pain medicine, take it 30 or more minutes before you do any wound care or as told by your health care provider.  General instructions  Return to your normal activities as told by your health care provider. Ask your health care provider what activities are safe.  Do not scratch or pick at the wound.  Do not use any products that contain nicotine or tobacco, such as cigarettes and e-cigarettes. These may delay wound healing. If you need help quitting, ask your health care provider.  Keep all follow-up visits as told by your health care provider. This is important.  Eat a diet that includes protein, vitamin A, vitamin C, and other nutrient-rich foods to help the wound heal. ? Foods rich in protein include meat, dairy, beans, nuts, and other sources. ? Foods rich in vitamin A include  carrots and dark green, leafy vegetables. ? Foods rich in vitamin C include citrus, tomatoes, and other fruits and vegetables. ? Nutrient-rich foods have protein, carbohydrates, fat, vitamins, or minerals. Eat a variety of healthy foods including vegetables, fruits, and whole grains. Contact a health care provider if:  You received a tetanus shot and you have swelling, severe pain, redness, or bleeding at the injection site.  Your pain is not controlled with medicine.  You have redness, swelling, or pain  around the wound.  You have fluid or blood coming from the wound.  Your wound feels warm to the touch.  You have pus or a bad smell coming from the wound.  You have a fever or chills.  You are nauseous or you vomit.  You are dizzy. Get help right away if:  You have a red streak going away from your wound.  The edges of the wound open up and separate.  Your wound is bleeding, and the bleeding does not stop with gentle pressure.  You have a rash.  You faint.  You have trouble breathing. Summary  Always wash your hands with soap and water before changing your bandage (dressing).  To help with healing, eat foods that are rich in protein, vitamin A, vitamin C, and other nutrients.  Check your wound every day for signs of infection. Contact your health care provider if you suspect that your wound is infected. This information is not intended to replace advice given to you by your health care provider. Make sure you discuss any questions you have with your health care provider. Document Revised: 11/14/2018 Document Reviewed: 02/11/2016 Elsevier Patient Education  [employer].  If you need a refill on your cardiac medications before your next appointment, please call your pharmacy.

## 2019-10-28 ENCOUNTER — Other Ambulatory Visit: Payer: Self-pay

## 2019-10-28 ENCOUNTER — Emergency Department
Admission: EM | Admit: 2019-10-28 | Discharge: 2019-10-28 | Disposition: A | Discharge status: Home / Self Care | Payer: Self-pay | Attending: Emergency Medicine | Admitting: Emergency Medicine

## 2019-10-28 ENCOUNTER — Encounter: Payer: Self-pay | Admitting: Emergency Medicine

## 2019-10-28 ENCOUNTER — Emergency Department: Payer: Self-pay

## 2019-10-28 DIAGNOSIS — R4701 Aphasia: Secondary | ICD-10-CM | POA: Insufficient documentation

## 2019-10-28 DIAGNOSIS — F17210 Nicotine dependence, cigarettes, uncomplicated: Secondary | ICD-10-CM | POA: Insufficient documentation

## 2019-10-28 DIAGNOSIS — Z7982 Long term (current) use of aspirin: Secondary | ICD-10-CM | POA: Insufficient documentation

## 2019-10-28 DIAGNOSIS — Z79899 Other long term (current) drug therapy: Secondary | ICD-10-CM | POA: Insufficient documentation

## 2019-10-28 DIAGNOSIS — H539 Unspecified visual disturbance: Secondary | ICD-10-CM

## 2019-10-28 NOTE — ED Triage Notes (Addendum)
Pt arrived via POV was brought in by a friend.    Pt states she woke up around 6am, states prior to coming into the ED she noted to be seeing a rainbow in L eye, pt recently admitted with a stroke,  Pt does have some expressive aphasia noted on arrival, unsure if this is from previous stroke or new findings. According to hospital notes, pt had expressive aphasia when she presented with previous stroke.   Pt had recent loop recorder implant done on 3/15.

## 2019-10-28 NOTE — Discharge Instructions (Addendum)
Please call the number provided for ophthalmology Monday morning and inform them you're seen in the ER follow-up appointment as soon as possible for an eye exam. Return to the emergency department for any further visual disturbances or decreased vision, any weakness or numbness of any arm or leg, significant headache, or any other symptom personally concerning to yourself.

## 2019-10-28 NOTE — ED Notes (Signed)
Discussed with Dr. Mayford Knife, new order received for CT head at this time.

## 2019-10-28 NOTE — ED Provider Notes (Signed)
[facility] Emergency Department Provider Note  Time seen: 2:18 PM  I have reviewed the triage vital signs and the nursing notes.   HISTORY  Chief Complaint Eye Problem   HPI Robin Mosley is a 64 y.o. female with a past medical history of a recent stroke last month, presents to the emergency department for visual disturbance. According to the patient around 6 AM this morning, shortly after waking up she noticed what appeared to be a rainbow in her left visual field. Patient states it lasted approximately 3 to 5 minutes and then resolved on its own. Patient states he was able to see out of the left eye but it looked like there was a rainbow in part of her vision which she describes as right and multicolored that moved when she moved her eye. States she has never had this occur before. Denies any headache at the time of the visual disturbance or thereafter. Denies any weakness or numbness of any arm or leg. Patient does have mild aphasia, but this is chronic since the stroke. No other medical concerns per patient. Vision is normal per patient. No eye pain at any point.  Past Medical History:  Diagnosis Date  . Diverticulitis   . IBS (irritable bowel syndrome)   . Seasonal allergies     Patient Active Problem List   Diagnosis Date Noted  . Acute ischemic stroke ([place]) 10/05/2019    Past Surgical History:  Procedure Laterality Date  . CARPAL TUNNEL RELEASE    . CESAREAN SECTION    . CHOLECYSTECTOMY    . TEE WITHOUT CARDIOVERSION N/A 10/06/2019   Procedure: TRANSESOPHAGEAL ECHOCARDIOGRAM (TEE);  Surgeon: Minna Merritts, MD;  Location: [facility] ORS;  Service: Cardiovascular;  Laterality: N/A;    Prior to Admission medications   Medication Sig Start Date End Date Taking? Authorizing Provider  amLODipine (NORVASC) 5 MG tablet Take 1 tablet (5 mg total) by mouth daily. 10/07/19 10/06/20  Edwin Dada, MD  aspirin EC 325 MG EC tablet Take 1 tablet (325 mg  total) by mouth daily. 10/08/19   Danford, Suann Larry, MD  rosuvastatin (CRESTOR) 40 MG tablet Take 1 tablet (40 mg total) by mouth daily at 6 PM. 10/07/19   Danford, Suann Larry, MD    Allergies  Allergen Reactions  . Sulfa Antibiotics Hives  . Tetracyclines & Related Hives    Family History  Problem Relation Age of Onset  . Heart disease Mother     Social History Social History   Tobacco Use  . Smoking status: Current Every Day Smoker    Types: Cigarettes  . Smokeless tobacco: Never Used  Substance Use Topics  . Alcohol use: Yes    Comment: social  . Drug use: No    Review of Systems Constitutional: Negative for fever. Eyes: Rainbow like light disturbance in the left eye lasting 3 to 5 minutes this morning which has since resolved. Cardiovascular: Negative for chest pain. Respiratory: Negative for shortness of breath. Gastrointestinal: Negative for abdominal pain Musculoskeletal: Negative for musculoskeletal complaints Neurological: No headache today at any point. Denies any weakness or numbness. All other ROS negative  ____________________________________________   PHYSICAL EXAM:  VITAL SIGNS: ED Triage Vitals  Enc Vitals Group     BP 10/28/19 1135 (!) 161/77     Pulse Rate 10/28/19 1135 80     Resp 10/28/19 1135 18     Temp 10/28/19 1135 98.3 F (36.8 C)     Temp Source  10/28/19 1135 Oral     SpO2 10/28/19 1135 97 %     Weight 10/28/19 1134 158 lb (71.7 kg)     Height 10/28/19 1134 5\' 1"  (1.549 m)     Head Circumference --      Peak Flow --      Pain Score 10/28/19 1135 0     Pain Loc --      Pain Edu? --      Excl. in GC? --    Constitutional: Alert and oriented. Well appearing and in no distress. Eyes: Normal exam, EOMI ENT      Head: Normocephalic and atraumatic.      Mouth/Throat: Mucous membranes are moist. Cardiovascular: Normal rate, regular rhythm.  Respiratory: Normal respiratory effort without tachypnea nor retractions. Breath  sounds are clear  Gastrointestinal: Soft and nontender. No distention. Musculoskeletal: Nontender with normal range of motion in all extremities.  Neurologic: Very slight expressive aphasia. Equal grip strengths. No pronator drift. Skin:  Skin is warm, dry Psychiatric: Mood and affect are normal.   ____________________________________________   RADIOLOGY  CT scan shows prior/old moderate left parietal temporal CVA, no acute findings.  ____________________________________________   INITIAL IMPRESSION / ASSESSMENT AND PLAN / ED COURSE  Pertinent labs & imaging results that were available during my care of the patient were reviewed by me and considered in my medical decision making (see chart for details).   Patient presents to the emergency department with a visual disturbance in her left eye this morning around 6 AM. Shortly after awakening saw a light disturbance in her left eye which she describes as bright rainbow colored type lights in the periphery of her left vision. This lasted approximately 3 to 5 minutes before resolving. Patient denies any headache at any point. Denies any weakness or numbness or confusion. States her vision is normal currently. Has not had any recurrence of the issue since 6 AM this morning. Overall the patient appears well. CT scan shows old CVA but no acute findings. Given the patient's reassuring physical exam and lack of symptoms currently I do believe the patient would be safe for discharge home with ophthalmology follow-up on Monday. I did discuss with the patient if symptoms recur at any point she develops decreased vision weakness or numbness she is to return to the emergency department. Patient is agreeable to plan of care.  Robin Mosley was evaluated in Emergency Department on 10/28/2019 for the symptoms described in the history of present illness. She was evaluated in the context of the global COVID-19 pandemic, which necessitated consideration that the  patient might be at risk for infection with the SARS-CoV-2 virus that causes COVID-19. Institutional protocols and algorithms that pertain to the evaluation of patients at risk for COVID-19 are in a state of rapid change based on information released by regulatory bodies including the CDC and federal and state organizations. These policies and algorithms were followed during the patient's care in the ED.  ____________________________________________   FINAL CLINICAL IMPRESSION(S) / ED DIAGNOSES  Visual disturbance   10/30/2019, MD 10/28/19 1423

## 2019-10-28 NOTE — ED Notes (Signed)
Pt ambulatory upon discharge.  

## 2019-11-07 ENCOUNTER — Telehealth (INDEPENDENT_AMBULATORY_CARE_PROVIDER_SITE_OTHER): Payer: Self-pay | Admitting: *Deleted

## 2019-11-07 ENCOUNTER — Other Ambulatory Visit: Payer: Self-pay

## 2019-11-07 ENCOUNTER — Telehealth: Payer: Self-pay | Admitting: Emergency Medicine

## 2019-11-07 DIAGNOSIS — I639 Cerebral infarction, unspecified: Secondary | ICD-10-CM

## 2019-11-07 LAB — CUP PACEART INCLINIC DEVICE CHECK
Date Time Interrogation Session: 20210330095800
Implantable Pulse Generator Implant Date: 20210315

## 2019-11-07 NOTE — Patient Instructions (Signed)
Call if you have any redness, drainage or swelling at the incision site.

## 2019-11-07 NOTE — Progress Notes (Signed)
  ILR wound check in clinic. Steri strips removed prior to visit. Wound well healed. R waves 1.43 mV. Home monitor transmitting nightly. No episodes. Questions answered.

## 2019-11-23 ENCOUNTER — Ambulatory Visit: Payer: MEDICAID | Attending: Internal Medicine

## 2019-11-23 ENCOUNTER — Ambulatory Visit (INDEPENDENT_AMBULATORY_CARE_PROVIDER_SITE_OTHER): Payer: Self-pay | Admitting: *Deleted

## 2019-11-23 DIAGNOSIS — Z23 Encounter for immunization: Secondary | ICD-10-CM

## 2019-11-23 DIAGNOSIS — I639 Cerebral infarction, unspecified: Secondary | ICD-10-CM

## 2019-11-23 LAB — CUP PACEART REMOTE DEVICE CHECK
Date Time Interrogation Session: 20210415084949
Implantable Pulse Generator Implant Date: 20210315

## 2019-11-23 NOTE — Progress Notes (Signed)
   Covid-19 Vaccination Clinic  Name:  Robin Mosley    MRN: [id number] DOB: [birth year]  11/23/2019  Robin Mosley was observed post Covid-19 immunization for 15 minutes without incident. She was provided with Vaccine Information Sheet and instruction to access the V-Safe system.   Robin Mosley was instructed to call 911 with any severe reactions post vaccine: Marland Kitchen Difficulty breathing  . Swelling of face and throat  . A fast heartbeat  . A bad rash all over body  . Dizziness and weakness   Immunizations Administered    Name Date Dose VIS Date Route   Pfizer COVID-19 Vaccine 11/23/2019  1:14 PM 0.3 mL 07/21/2019 Intramuscular   Manufacturer: [employer], [employer]   Lot: [id number]   NDC: [address]-8

## 2019-11-23 NOTE — Progress Notes (Signed)
ILR Remote 

## 2019-11-24 NOTE — Telephone Encounter (Signed)
Opened in error

## 2019-12-18 ENCOUNTER — Ambulatory Visit: Payer: MEDICAID | Attending: Internal Medicine

## 2019-12-18 DIAGNOSIS — Z23 Encounter for immunization: Secondary | ICD-10-CM

## 2019-12-18 NOTE — Progress Notes (Signed)
   Covid-19 Vaccination Clinic  Name:  Robin Mosley    MRN: [id number] DOB: 06/07/1956  12/18/2019  [name] was observed post Covid-19 immunization for 15 minutes without incident. She was provided with Vaccine Information Sheet and instruction to access the V-Safe system.   [name] was instructed to call 911 with any severe reactions post vaccine: Marland Kitchen Difficulty breathing  . Swelling of face and throat  . A fast heartbeat  . A bad rash all over body  . Dizziness and weakness   Immunizations Administered    Name Date Dose VIS Date Route   Pfizer COVID-19 Vaccine 12/18/2019  1:04 PM 0.3 mL 10/04/2018 Intramuscular   Manufacturer: [employer], [employer]   Lot: [id number]   NDC: [address]-5

## 2019-12-25 ENCOUNTER — Ambulatory Visit (INDEPENDENT_AMBULATORY_CARE_PROVIDER_SITE_OTHER): Payer: Self-pay | Admitting: *Deleted

## 2019-12-25 DIAGNOSIS — I639 Cerebral infarction, unspecified: Secondary | ICD-10-CM

## 2019-12-25 LAB — CUP PACEART REMOTE DEVICE CHECK
Date Time Interrogation Session: 20210516085438
Implantable Pulse Generator Implant Date: 20210315

## 2019-12-26 NOTE — Progress Notes (Signed)
Carelink Summary Report / Loop Recorder 

## 2020-01-29 ENCOUNTER — Ambulatory Visit (INDEPENDENT_AMBULATORY_CARE_PROVIDER_SITE_OTHER): Payer: Self-pay | Admitting: *Deleted

## 2020-01-29 DIAGNOSIS — I639 Cerebral infarction, unspecified: Secondary | ICD-10-CM

## 2020-01-29 LAB — CUP PACEART REMOTE DEVICE CHECK
Date Time Interrogation Session: 20210620233431
Implantable Pulse Generator Implant Date: 20210315

## 2020-01-30 NOTE — Progress Notes (Signed)
Carelink Summary Report / Loop Recorder 

## 2020-02-13 ENCOUNTER — Telehealth: Payer: Self-pay

## 2020-02-13 NOTE — Telephone Encounter (Signed)
I let the pt know her monitor is automatic and she only have to send when the nurse ask her to.

## 2020-03-04 ENCOUNTER — Ambulatory Visit (INDEPENDENT_AMBULATORY_CARE_PROVIDER_SITE_OTHER): Payer: Self-pay | Admitting: *Deleted

## 2020-03-04 DIAGNOSIS — I639 Cerebral infarction, unspecified: Secondary | ICD-10-CM

## 2020-03-05 LAB — CUP PACEART REMOTE DEVICE CHECK
Date Time Interrogation Session: 20210725233808
Implantable Pulse Generator Implant Date: 20210315

## 2020-03-07 NOTE — Progress Notes (Signed)
Carelink Summary Report / Loop Recorder 

## 2020-04-08 ENCOUNTER — Ambulatory Visit (INDEPENDENT_AMBULATORY_CARE_PROVIDER_SITE_OTHER): Payer: Self-pay | Admitting: *Deleted

## 2020-04-08 DIAGNOSIS — I639 Cerebral infarction, unspecified: Secondary | ICD-10-CM

## 2020-04-09 LAB — CUP PACEART REMOTE DEVICE CHECK
Date Time Interrogation Session: 20210827235053
Implantable Pulse Generator Implant Date: 20210315

## 2020-04-10 NOTE — Progress Notes (Signed)
Carelink Summary Report / Loop Recorder 

## 2020-05-13 ENCOUNTER — Ambulatory Visit (INDEPENDENT_AMBULATORY_CARE_PROVIDER_SITE_OTHER): Payer: Self-pay

## 2020-05-13 DIAGNOSIS — I639 Cerebral infarction, unspecified: Secondary | ICD-10-CM

## 2020-05-13 LAB — CUP PACEART REMOTE DEVICE CHECK
Date Time Interrogation Session: 20210929235127
Implantable Pulse Generator Implant Date: 20210315

## 2020-05-15 NOTE — Progress Notes (Signed)
Carelink Summary Report / Loop Recorder 

## 2020-06-14 LAB — CUP PACEART REMOTE DEVICE CHECK
Date Time Interrogation Session: 20211101235502
Implantable Pulse Generator Implant Date: 20210315

## 2020-06-17 ENCOUNTER — Ambulatory Visit (INDEPENDENT_AMBULATORY_CARE_PROVIDER_SITE_OTHER): Payer: Self-pay

## 2020-06-17 DIAGNOSIS — I639 Cerebral infarction, unspecified: Secondary | ICD-10-CM

## 2020-06-17 NOTE — Progress Notes (Signed)
Carelink Summary Report / Loop Recorder 

## 2020-07-21 LAB — CUP PACEART REMOTE DEVICE CHECK
Date Time Interrogation Session: 20211204225903
Implantable Pulse Generator Implant Date: 20210315

## 2020-07-22 ENCOUNTER — Ambulatory Visit (INDEPENDENT_AMBULATORY_CARE_PROVIDER_SITE_OTHER): Payer: Self-pay

## 2020-07-22 DIAGNOSIS — I639 Cerebral infarction, unspecified: Secondary | ICD-10-CM

## 2020-08-05 NOTE — Progress Notes (Signed)
Carelink Summary Report / Loop Recorder 

## 2020-08-26 ENCOUNTER — Ambulatory Visit (INDEPENDENT_AMBULATORY_CARE_PROVIDER_SITE_OTHER): Payer: Self-pay

## 2020-08-26 DIAGNOSIS — I639 Cerebral infarction, unspecified: Secondary | ICD-10-CM

## 2020-08-28 LAB — CUP PACEART REMOTE DEVICE CHECK
Date Time Interrogation Session: 20220116000911
Implantable Pulse Generator Implant Date: 20210315

## 2020-09-09 NOTE — Progress Notes (Signed)
Carelink Summary Report / Loop Recorder 

## 2020-09-27 LAB — CUP PACEART REMOTE DEVICE CHECK
Date Time Interrogation Session: 20220218002910
Implantable Pulse Generator Implant Date: 20210315

## 2020-09-30 ENCOUNTER — Ambulatory Visit (INDEPENDENT_AMBULATORY_CARE_PROVIDER_SITE_OTHER): Payer: Self-pay

## 2020-09-30 DIAGNOSIS — I639 Cerebral infarction, unspecified: Secondary | ICD-10-CM

## 2020-10-03 NOTE — Progress Notes (Signed)
Carelink Summary Report / Loop Recorder 

## 2020-11-03 ENCOUNTER — Emergency Department: Payer: Self-pay

## 2020-11-03 ENCOUNTER — Other Ambulatory Visit: Payer: Self-pay

## 2020-11-03 ENCOUNTER — Encounter: Payer: Self-pay | Admitting: Internal Medicine

## 2020-11-03 ENCOUNTER — Observation Stay
Admission: EM | Admit: 2020-11-03 | Discharge: 2020-11-04 | Disposition: A | Discharge status: Home / Self Care | Payer: Self-pay | Attending: Internal Medicine | Admitting: Internal Medicine

## 2020-11-03 DIAGNOSIS — Z20822 Contact with and (suspected) exposure to covid-19: Secondary | ICD-10-CM | POA: Insufficient documentation

## 2020-11-03 DIAGNOSIS — E785 Hyperlipidemia, unspecified: Secondary | ICD-10-CM | POA: Diagnosis present

## 2020-11-03 DIAGNOSIS — R569 Unspecified convulsions: Principal | ICD-10-CM

## 2020-11-03 DIAGNOSIS — R29818 Other symptoms and signs involving the nervous system: Secondary | ICD-10-CM

## 2020-11-03 DIAGNOSIS — F17210 Nicotine dependence, cigarettes, uncomplicated: Secondary | ICD-10-CM | POA: Insufficient documentation

## 2020-11-03 DIAGNOSIS — G9341 Metabolic encephalopathy: Secondary | ICD-10-CM | POA: Diagnosis present

## 2020-11-03 DIAGNOSIS — I639 Cerebral infarction, unspecified: Secondary | ICD-10-CM | POA: Diagnosis present

## 2020-11-03 DIAGNOSIS — Z720 Tobacco use: Secondary | ICD-10-CM | POA: Diagnosis present

## 2020-11-03 DIAGNOSIS — I10 Essential (primary) hypertension: Secondary | ICD-10-CM | POA: Diagnosis present

## 2020-11-03 HISTORY — DX: Hyperlipidemia, unspecified: E78.5

## 2020-11-03 HISTORY — DX: Cerebral infarction, unspecified: I63.9

## 2020-11-03 HISTORY — DX: Tobacco use: Z72.0

## 2020-11-03 HISTORY — DX: Essential (primary) hypertension: I10

## 2020-11-03 LAB — CBC
HCT: 41.3 % (ref 36.0–46.0)
Hemoglobin: 13.8 g/dL (ref 12.0–15.0)
MCH: 28.3 pg (ref 26.0–34.0)
MCHC: 33.4 g/dL (ref 30.0–36.0)
MCV: 84.6 fL (ref 80.0–100.0)
Platelets: 304 10*3/uL (ref 150–400)
RBC: 4.88 MIL/uL (ref 3.87–5.11)
RDW: 13.3 % (ref 11.5–15.5)
WBC: 6.1 10*3/uL (ref 4.0–10.5)
nRBC: 0 % (ref 0.0–0.2)

## 2020-11-03 LAB — URINALYSIS, ROUTINE W REFLEX MICROSCOPIC
Bacteria, UA: NONE SEEN
Bilirubin Urine: NEGATIVE
Glucose, UA: NEGATIVE mg/dL
Ketones, ur: NEGATIVE mg/dL
Leukocytes,Ua: NEGATIVE
Nitrite: NEGATIVE
Protein, ur: NEGATIVE mg/dL
Specific Gravity, Urine: 1.029 (ref 1.005–1.030)
Squamous Epithelial / HPF: NONE SEEN (ref 0–5)
WBC, UA: NONE SEEN WBC/hpf (ref 0–5)
pH: 5 (ref 5.0–8.0)

## 2020-11-03 LAB — COMPREHENSIVE METABOLIC PANEL
ALT: 18 U/L (ref 0–44)
ALT: 19 U/L (ref 0–44)
AST: 18 U/L (ref 15–41)
AST: 22 U/L (ref 15–41)
Albumin: 4.2 g/dL (ref 3.5–5.0)
Albumin: 4.1 g/dL (ref 3.5–5.0)
Alkaline Phosphatase: 108 U/L (ref 38–126)
Alkaline Phosphatase: 106 U/L (ref 38–126)
Anion gap: 10 (ref 5–15)
Anion gap: 10 (ref 5–15)
BUN: 12 mg/dL (ref 8–23)
BUN: 12 mg/dL (ref 8–23)
CO2: 27 mmol/L (ref 22–32)
CO2: 27 mmol/L (ref 22–32)
Calcium: 9 mg/dL (ref 8.9–10.3)
Calcium: 9.1 mg/dL (ref 8.9–10.3)
Chloride: 101 mmol/L (ref 98–111)
Chloride: 103 mmol/L (ref 98–111)
Creatinine, Ser: 0.85 mg/dL (ref 0.44–1.00)
Creatinine, Ser: 0.67 mg/dL (ref 0.44–1.00)
GFR, Estimated: >60 mL/min (ref >60)
GFR, Estimated: >60 mL/min (ref >60)
Glucose, Bld: 96 mg/dL (ref 70–99)
Glucose, Bld: 104 mg/dL — ABNORMAL HIGH (ref 70–99)
Potassium: 3.9 mmol/L (ref 3.5–5.1)
Potassium: 3.8 mmol/L (ref 3.5–5.1)
Sodium: 138 mmol/L (ref 135–145)
Sodium: 140 mmol/L (ref 135–145)
Total Bilirubin: 0.6 mg/dL (ref 0.3–1.2)
Total Bilirubin: 0.7 mg/dL (ref 0.3–1.2)
Total Protein: 8.2 g/dL — ABNORMAL HIGH (ref 6.5–8.1)
Total Protein: 8 g/dL (ref 6.5–8.1)

## 2020-11-03 LAB — DIFFERENTIAL
Abs Immature Granulocytes: 0.03 10*3/uL (ref 0.00–0.07)
Basophils Absolute: 0 10*3/uL (ref 0.0–0.1)
Basophils Relative: 0 %
Eosinophils Absolute: 0.1 10*3/uL (ref 0.0–0.5)
Eosinophils Relative: 1 %
Immature Granulocytes: 0 %
Lymphocytes Relative: 16 %
Lymphs Abs: 1.5 10*3/uL (ref 0.7–4.0)
Monocytes Absolute: 0.4 10*3/uL (ref 0.1–1.0)
Monocytes Relative: 5 %
Neutro Abs: 7.2 10*3/uL (ref 1.7–7.7)
Neutrophils Relative %: 78 %

## 2020-11-03 LAB — CUP PACEART REMOTE DEVICE CHECK
Date Time Interrogation Session: 20220323012805
Implantable Pulse Generator Implant Date: 20210315

## 2020-11-03 LAB — URINE DRUG SCREEN, QUALITATIVE (ARMC ONLY)
Amphetamines, Ur Screen: NOT DETECTED
Barbiturates, Ur Screen: NOT DETECTED
Benzodiazepine, Ur Scrn: POSITIVE — AB
Cannabinoid 50 Ng, Ur ~~LOC~~: NOT DETECTED
Cocaine Metabolite,Ur ~~LOC~~: NOT DETECTED
MDMA (Ecstasy)Ur Screen: NOT DETECTED
Methadone Scn, Ur: NOT DETECTED
Opiate, Ur Screen: NOT DETECTED
Phencyclidine (PCP) Ur S: NOT DETECTED
Tricyclic, Ur Screen: NOT DETECTED

## 2020-11-03 LAB — ETHANOL: Alcohol, Ethyl (B): <10 mg/dL (ref <10)

## 2020-11-03 LAB — APTT: aPTT: 35 seconds (ref 24–36)

## 2020-11-03 LAB — PROTIME-INR
INR: 1 (ref 0.8–1.2)
Prothrombin Time: 12.7 seconds (ref 11.4–15.2)

## 2020-11-03 LAB — RESP PANEL BY RT-PCR (FLU A&B, COVID) ARPGX2
Influenza A by PCR: NEGATIVE
Influenza B by PCR: NEGATIVE
SARS Coronavirus 2 by RT PCR: NEGATIVE

## 2020-11-03 MED ORDER — ROSUVASTATIN CALCIUM 20 MG PO TABS
40.0000 mg | ORAL_TABLET | Freq: Every day | ORAL | Status: DC
Start: 2020-11-03 — End: 2020-11-04
  Administered 2020-11-04: 10:00:00 40 mg via ORAL
  Filled 2020-11-03 (×2): qty 2

## 2020-11-03 MED ORDER — LEVETIRACETAM IN NACL 1000 MG/100ML IV SOLN
1000.0000 mg | Freq: Two times a day (BID) | INTRAVENOUS | Status: DC
  Administered 2020-11-04: 02:00:00 1000 mg via INTRAVENOUS
  Filled 2020-11-03 (×3): qty 100

## 2020-11-03 MED ORDER — MIDAZOLAM HCL 5 MG/5ML IJ SOLN
5.0000 mg | Freq: Once | INTRAMUSCULAR | Status: AC
  Administered 2020-11-03: 5 mg via INTRAVENOUS

## 2020-11-03 MED ORDER — ACETAMINOPHEN 650 MG RE SUPP: 650.0000 mg | Freq: Four times a day (QID) | RECTAL | Status: DC | PRN

## 2020-11-03 MED ORDER — LORAZEPAM 2 MG/ML IJ SOLN: 2.0000 mg | INTRAMUSCULAR | Status: DC | PRN

## 2020-11-03 MED ORDER — ONDANSETRON HCL 4 MG/2ML IJ SOLN: 4.0000 mg | Freq: Three times a day (TID) | INTRAMUSCULAR | Status: DC | PRN

## 2020-11-03 MED ORDER — ASPIRIN EC 325 MG PO TBEC
325.0000 mg | DELAYED_RELEASE_TABLET | Freq: Every day | ORAL | Status: DC
  Administered 2020-11-04: 325 mg via ORAL
  Filled 2020-11-03: qty 1

## 2020-11-03 MED ORDER — HYDRALAZINE HCL 20 MG/ML IJ SOLN: 5.0000 mg | INTRAMUSCULAR | Status: DC | PRN

## 2020-11-03 MED ORDER — SODIUM CHLORIDE 0.9 % IV SOLN
3000.0000 mg | INTRAVENOUS | Status: AC
  Administered 2020-11-03: 3000 mg via INTRAVENOUS
  Filled 2020-11-03: qty 30

## 2020-11-03 MED ORDER — ENOXAPARIN SODIUM 40 MG/0.4ML ~~LOC~~ SOLN
40.0000 mg | SUBCUTANEOUS | Status: DC
  Administered 2020-11-04: 10:00:00 40 mg via SUBCUTANEOUS
  Filled 2020-11-03: qty 0.4

## 2020-11-03 MED ORDER — AMLODIPINE BESYLATE 5 MG PO TABS
5.0000 mg | ORAL_TABLET | Freq: Every day | ORAL | Status: DC
  Administered 2020-11-04: 5 mg via ORAL
  Filled 2020-11-03 (×2): qty 1

## 2020-11-03 MED ORDER — ASPIRIN EC 81 MG PO TBEC: 81.0000 mg | DELAYED_RELEASE_TABLET | Freq: Every day | ORAL | Status: DC

## 2020-11-03 MED ORDER — ACETAMINOPHEN 325 MG PO TABS: 650.0000 mg | ORAL_TABLET | Freq: Four times a day (QID) | ORAL | Status: DC | PRN

## 2020-11-03 MED ORDER — ENOXAPARIN SODIUM 40 MG/0.4ML ~~LOC~~ SOLN: 40.0000 mg | SUBCUTANEOUS | Status: DC

## 2020-11-03 MED ORDER — SODIUM CHLORIDE 0.9 % IV SOLN: INTRAVENOUS | Status: DC

## 2020-11-03 MED ORDER — IOHEXOL 350 MG/ML SOLN
75.0000 mL | Freq: Once | INTRAVENOUS | Status: AC | PRN
  Administered 2020-11-03: 75 mL via INTRAVENOUS

## 2020-11-03 MED ORDER — NICOTINE 21 MG/24HR TD PT24
21.0000 mg | MEDICATED_PATCH | Freq: Every day | TRANSDERMAL | Status: DC
  Filled 2020-11-03 (×2): qty 1

## 2020-11-03 MED ORDER — IOHEXOL 350 MG/ML SOLN
40.0000 mL | Freq: Once | INTRAVENOUS | Status: AC | PRN
  Administered 2020-11-03: 40 mL via INTRAVENOUS

## 2020-11-03 NOTE — ED Notes (Signed)
Pt had witnessed seizure while in CT by this RN. Neurologist in CT as well to witness. 5mg  IV Versed given per neurologist order. Pt seizure activity stopped after medication administration. Unclear if PMH of seizures due to poor history obtained. IV Keppra started per Neurologist.

## 2020-11-03 NOTE — ED Provider Notes (Signed)
[facility] Emergency Department Provider Note   ____________________________________________   Event Date/Time   First MD Initiated Contact with Patient 11/03/20 1103     (approximate)  I have reviewed the triage vital signs and the nursing notes.   HISTORY  Chief Complaint Altered Mental Status  EM caveat: Poor historian reports she feels little confused  HPI Robin Mosley is a 65 y.o. female   tells me she got up today, not quite sure when and she is not certain of when symptoms happened.  I tried very hard to delineate a start time but she cannot really identify it other than something occurred this morning.  She does relate that she normally gets up at about 7 AM, and the symptoms started sometime around when she got up but she does not know when she actually got up today  She been feeling a little off.  She is noticed some strange feeling a little bit of feeling of a weakness in her right arm.  She also relates that she had a previous stroke but cannot really tell me if she had weakness from that before.  Denies headache.  No chest pain or trouble breathing.  She has noticed that she just feels like she cannot think clearly today  No past medical history on file.  Patient Active Problem List   Diagnosis Date Noted  . Seizure (HCC) 11/03/2020  . Acute metabolic encephalopathy 11/03/2020  . Seizures (HCC) 11/03/2020      Prior to Admission medications   Not on File  Patient unclear on her current medication regimen  Allergies Elemental sulfur, Sulfonylureas, and Tetracyclines & related  No family history on file.  Social History    Review of Systems -caveat, felt somewhat unreliable Constitutional: No fever/chills Eyes: No visual changes ENT: No sore throat. Cardiovascular: Denies chest pain. Respiratory: Denies shortness of breath. Musculoskeletal: Reports that her right arm feels little weak Skin: Negative for rash. Neurological:  Negative for headaches, areas of focal weakness or numbness.  Reports he just feels little confused    ____________________________________________   PHYSICAL EXAM:  VITAL SIGNS: ED Triage Vitals [11/03/20 1101]  Enc Vitals Group     BP      Pulse      Resp      Temp      Temp src      SpO2      Weight      Height      Head Circumference      Peak Flow      Pain Score 0     Pain Loc      Pain Edu?      Excl. in GC?     Constitutional: Alert and oriented. Well appearing and in no acute distress.  She does have some obvious dysarthria possibly some word finding difficulty as well Eyes: Conjunctivae are normal. Head: Atraumatic. Nose: No congestion/rhinnorhea. Mouth/Throat: Mucous membranes are moist. Neck: No stridor.  Cardiovascular: Normal rate, regular rhythm. Grossly normal heart sounds.  Good peripheral circulation. Respiratory: Normal respiratory effort.  No retractions. Lungs CTAB. Gastrointestinal: Soft and nontender. No distention. Musculoskeletal: No lower extremity tenderness nor edema. Neurologic:  NIH = 2 -dysarthria and right arm drift.  Cranial nerves appear to be intact but somewhat difficult exam as she seems very poor historian I question if she may have a little bit of receptive aphasia as well.  She is fully alert however.  She does demonstrate use of  all extremities but the right arm does demonstrate slight drift.  In addition she has some slight dysarthria.  The remainder of her NIH exam to me is unremarkable Skin:  Skin is warm, dry and intact. No rash noted. Psychiatric: Mood and affect are normal. Speech and behavior are normal.  ____________________________________________   LABS (all labs ordered are listed, but only abnormal results are displayed)  Labs Reviewed  COMPREHENSIVE METABOLIC PANEL - Abnormal; Notable for the following components:      Result Value   Total Protein 8.2 (*)    All other components within normal limits  URINALYSIS,  ROUTINE W REFLEX MICROSCOPIC - Abnormal; Notable for the following components:   Color, Urine STRAW (*)    APPearance CLEAR (*)    Hgb urine dipstick MODERATE (*)    All other components within normal limits  RESP PANEL BY RT-PCR (FLU A&B, COVID) ARPGX2  CBC  ETHANOL  PROTIME-INR  APTT  DIFFERENTIAL  COMPREHENSIVE METABOLIC PANEL  URINE DRUG SCREEN, QUALITATIVE ([facility] ONLY)  AMMONIA   ____________________________________________  EKG  Reviewed interpreted by me at 11 AM Heart rate 79 QRS 110 QTc 430 Sinus rhythm, no evidence of acute ischemia. ____________________________________________  RADIOLOGY  CT ANGIOGRAM HEAD NECK W WO CONTRAST  Result Date: 11/03/2020 CLINICAL DATA:  Code stroke.  Altered mental status and aphasia EXAM: CT HEAD WITHOUT CONTRAST CT ANGIOGRAPHY OF THE HEAD AND NECK TECHNIQUE: Contiguous axial images were obtained from the base of the skull through the vertex without intravenous contrast. Multidetector CT imaging of the head and neck was performed using the standard protocol during bolus administration of intravenous contrast. Multiplanar CT image reconstructions and MIPs were obtained to evaluate the vascular anatomy. Carotid stenosis measurements (when applicable) are obtained utilizing NASCET criteria, using the distal internal carotid diameter as the denominator. CONTRAST:  39mL OMNIPAQUE IOHEXOL 350 MG/ML SOLN COMPARISON:  None. FINDINGS: CT HEAD Brain: There is no acute intracranial hemorrhage, mass effect, or edema. No acute appearing loss of gray-white differentiation. Chronic infarct present in the left parietotemporal lobes. There is no extra-axial fluid collection. Ventricles and sulci are within normal limits in size and configuration. Vascular: No hyperdense vessel. Skull: Calvarium is unremarkable. Sinuses/Orbits: No acute finding. Other: None. ASPECTS (Alberta Stroke Program Early CT Score) - Ganglionic level infarction (caudate, lentiform nuclei,  internal capsule, insula, M1-M3 cortex): 7 - Supraganglionic infarction (M4-M6 cortex): 3 Total score (0-10 with 10 being normal): 10 Review of the MIP images confirms the above findings CTA NECK Aortic arch: Great vessel origins are patent. Right carotid system: Patent. Mixed plaque at the ICA origin causing less than 50% stenosis. Left carotid system: Patent. Minimal noncalcified plaque at the ICA origin without stenosis. Vertebral arteries: Patent and codominant. No stenosis or evidence of dissection. Skeleton: Degenerative changes of the cervical spine. Other neck: Unremarkable. Upper chest: Included upper lungs are clear. Review of the MIP images confirms the above findings CTA HEAD Anterior circulation: Intracranial internal carotid arteries are patent with minor calcified plaque. Middle cerebral arteries are patent. There is high-grade stenosis of a proximal left M2 MCA branch. Posterior circulation: Intracranial vertebral arteries are patent. Basilar artery is patent. Major cerebellar artery origins are patent. Posterior cerebral arteries are patent. Venous sinuses: Patent as allowed by contrast bolus timing. Review of the MIP images confirms the above findings IMPRESSION: No acute intracranial hemorrhage or evidence of acute infarction. Chronic left parietotemporal infarct. No large vessel occlusion. Plaque at the ICA origins without hemodynamically significant stenosis. 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 These results were called by telephone at the time of interpretation on 11/03/2020 at 12:26 pm to provider Dr. Amada Jupiter, who verbally acknowledged these results. Electronically Signed   By: Guadlupe Spanish M.D.   On: 11/03/2020 12:32   CT CEREBRAL PERFUSION W CONTRAST  Result Date: 11/03/2020 CLINICAL DATA:  Code stroke follow-up EXAM: CT PERFUSION BRAIN TECHNIQUE: Multiphase CT imaging of the brain was performed following IV bolus contrast injection. Subsequent parametric perfusion  maps were calculated using RAPID software. CONTRAST:  82mL OMNIPAQUE IOHEXOL 350 MG/ML SOLN COMPARISON:  None. FINDINGS: CBF (<30%) Volume: 23mL Perfusion (Tmax>6.0s) volume: 30mL Mismatch Volume: 83mL Infarct Core: 0 mL Infarction Location: None. IMPRESSION: No evidence of core infarction or penumbra. Electronically Signed   By: Guadlupe Spanish M.D.   On: 11/03/2020 12:42   CT HEAD CODE STROKE WO CONTRAST  Result Date: 11/03/2020 CLINICAL DATA:  Code stroke.  Altered mental status and aphasia EXAM: CT HEAD WITHOUT CONTRAST CT ANGIOGRAPHY OF THE HEAD AND NECK TECHNIQUE: Contiguous axial images were obtained from the base of the skull through the vertex without intravenous contrast. Multidetector CT imaging of the head and neck was performed using the standard protocol during bolus administration of intravenous contrast. Multiplanar CT image reconstructions and MIPs were obtained to evaluate the vascular anatomy. Carotid stenosis measurements (when applicable) are obtained utilizing NASCET criteria, using the distal internal carotid diameter as the denominator. CONTRAST:  45mL OMNIPAQUE IOHEXOL 350 MG/ML SOLN COMPARISON:  None. FINDINGS: CT HEAD Brain: There is no acute intracranial hemorrhage, mass effect, or edema. No acute appearing loss of gray-white differentiation. Chronic infarct present in the left parietotemporal lobes. There is no extra-axial fluid collection. Ventricles and sulci are within normal limits in size and configuration. Vascular: No hyperdense vessel. Skull: Calvarium is unremarkable. Sinuses/Orbits: No acute finding. Other: None. ASPECTS (Alberta Stroke Program Early CT Score) - Ganglionic level infarction (caudate, lentiform nuclei, internal capsule, insula, M1-M3 cortex): 7 - Supraganglionic infarction (M4-M6 cortex): 3 Total score (0-10 with 10 being normal): 10 Review of the MIP images confirms the above findings CTA NECK Aortic arch: Great vessel origins are patent. Right carotid system:  Patent. Mixed plaque at the ICA origin causing less than 50% stenosis. Left carotid system: Patent. Minimal noncalcified plaque at the ICA origin without stenosis. Vertebral arteries: Patent and codominant. No stenosis or evidence of dissection. Skeleton: Degenerative changes of the cervical spine. Other neck: Unremarkable. Upper chest: Included upper lungs are clear. Review of the MIP images confirms the above findings CTA HEAD Anterior circulation: Intracranial internal carotid arteries are patent with minor calcified plaque. Middle cerebral arteries are patent. There is high-grade stenosis of a proximal left M2 MCA branch. Posterior circulation: Intracranial vertebral arteries are patent. Basilar artery is patent. Major cerebellar artery origins are patent. Posterior cerebral arteries are patent. Venous sinuses: Patent as allowed by contrast bolus timing. Review of the MIP images confirms the above findings IMPRESSION: No acute intracranial hemorrhage or evidence of acute infarction. Chronic left parietotemporal infarct. No large vessel occlusion. Plaque at the ICA origins without hemodynamically significant stenosis. 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 These results were called by telephone at the time of interpretation on 11/03/2020 at 12:26 pm to provider Dr. Amada Jupiter, who verbally acknowledged these results. Electronically Signed   By: Guadlupe Spanish M.D.   On: 11/03/2020 12:32     CT imaging reviewed, CT head negative for acute hemorrhage or infarct.  Some chronic findings  are noted.  Additional findings as noted above on CT angio, discussed with Dr. Amada JupiterKirkpatrick ____________________________________________   PROCEDURES  Procedure(s) performed: None  Procedures  Critical Care performed: Yes, see critical care note(s)  CRITICAL CARE Performed by: Sharyn CreamerMark Nickoli Bagheri   Total critical care time: 40 minutes  Critical care time was exclusive of separately billable procedures and  treating other patients.  Critical care was necessary to treat or prevent imminent or life-threatening deterioration.  Critical care was time spent personally by me on the following activities: development of treatment plan with patient and/or surrogate as well as nursing, discussions with consultants, evaluation of patient's response to treatment, examination of patient, obtaining history from patient or surrogate, ordering and performing treatments and interventions, ordering and review of laboratory studies, ordering and review of radiographic studies, pulse oximetry and re-evaluation of patient's condition.  ____________________________________________   INITIAL IMPRESSION / ASSESSMENT AND PLAN / ED COURSE  Pertinent labs & imaging results that were available during my care of the patient were reviewed by me and considered in my medical decision making (see chart for details).   Patient presents for confusion some dysarthria and question some receptive aphasia as well as weakness in the right upper arm.  She reports a previous stroke but she is not able to clearly identify with this prior stroke deficits are, and she certainly reports an acute change today.  The timeline is unclear and I have tried to pin this down but no witnesses, and she cannot clearly tell me when her symptoms started other than sometime around when she got up which is normally around 7 AM  I am very concerned though that she may have an acute infarct or other acute neurologic etiology driving this.  Code stroke is activated to obtain stat neurology consult and assess for possible stroke.  Unfortunately, there was some delay on my part and seeing her on presentation due to having to attend to a more critical patient in the hospital, but I did see her quite quickly after returning to ER and activated stroke process.  Clinical Course as of 11/03/20 1342  Sun Nov 03, 2020  1139 Going to see the patient at this time, had  previously assigned myself and was delayed due to a hospital CODE BLUE. [MQ]  1249 Patient had a witnessed seizure while being assessed by neurology team.  Has been treated with Versed, Keppra loading.  Dr. Amada JupiterKirkpatrick at bedside [MQ]  1250 Neurology advises no TPA, I am in agreement [MQ]  1309 Patient is somewhat somnolent but resting.  No distress no twitching or seizure-like activity. [MQ]    Clinical Course User Index [MQ] Sharyn CreamerQuale, Jame Seelig, MD   ----------------------------------------- 1:17 PM on 11/03/2020 -----------------------------------------  Patient's alertness is improving.  She is now alert, on reassessment improving.  Awaiting final recommendation for neurology at this time.  Dr. Amada JupiterKirkpatrick has seen and continues work-up  ____________________________________________   FINAL CLINICAL IMPRESSION(S) / ED DIAGNOSES  Final diagnoses:    Neurologic deficit, acute Seizure, nonrefractory without status epilepticus      Note:  This document was prepared using Dragon voice recognition software and may include unintentional dictation errors       Sharyn CreamerQuale, Allana Shrestha, MD 11/03/20 1343

## 2020-11-03 NOTE — Consult Note (Signed)
Neurology Consultation Reason for Consult: Aphasia Referring Physician:   CC: Aphasia  History is obtained from: Patient, report  HPI: Robin Mosley is a 65 y.o. female who felt relatively normal per report, though unclear if she spoke, at 7am this morning. She then went to her neighbors house, but unclear how long she had been having symptoms.  She was brought into the emergency department where code stroke was activated.  Initially per the nurse, she was able to answer some simple questions but by the time I evaluated her, she was fairly densely aphasic.  She was taken emergently to CT for a CT/CTA/CTP.  Shortly after performing these, she had a secondarily generalized seizure.   LKW: unclear, last night was normal per neighbor.  tpa given?: no, out of window.     ROS: Unable to obtain due to altered mental status.   PMH: Unable to assess secondary to patient's altered mental status.    FHx:  Unable to assess secondary to patient's altered mental status.    Social History: Unable to assess secondary to patient's altered mental status.     Exam: Current vital signs: Vitals:   11/03/20 1300 11/03/20 1516  BP: (!) 116/48 (!) 131/52  Pulse: 89 75  Resp: 20 17  Temp:  97.8 F (36.6 C)  SpO2: 96% 98%    Vital signs in last 24 hours:     Physical Exam  Constitutional: Appears well-developed and well-nourished.  Psych: Affect appropriate to situation Eyes: No scleral injection HENT: No OP obstruction MSK: no joint deformities.  Cardiovascular: Normal rate and regular rhythm.  Respiratory: Effort normal, non-labored breathing GI: Soft.  No distension. There is no tenderness.  Skin: WDI  Neuro: Mental Status: Patient is awake, alert, she repeatedly states "I don't know" when asked questions, but she is able to follow simple commands. Cranial Nerves: II: R hemianopia. Pupils are equal, round, and reactive to light.   III,IV, VI: does not cross midline to the left.   V: Facial sensation is symmetric to temperature VII: Facial movement with mild decerase NL fold on the right.  VIII: hearing is intact to voice X: Uvula elevates symmetrically XI: Shoulder shrug is symmetric. XII: tongue is midline without atrophy or fasciculations.  Motor: Tone is normal. Bulk is normal. 5/5 strength on the left, drifts on the right  Sensory: Sensation is diminished on the right.  Cerebellar: Does not perform.   While in CT she had a tonic clonic seizure with figure of four posturing with right warm extension, she then had versive left head turn to the left.   I have reviewed labs in epic and the results pertinent to this consultation are: Na 138 Cr 0.85  I have reviewed the images obtained: CT head - old R parietal infarct.   Impression: 65 yo F with new onset confusion/right sided weakness with observed tonic-clonic seizure. CTA with no LVO, suspect seizure as etiology of her symptoms today. Certainly with her previous stroke, she is at risk of this.   Recommendations: 1) Versed 5mg  IV given x1 2) Keppra 3g x 1, then 1g BID 3) EEG when able  4) neurology will continue to follow.   , MD [facility] [phone]  If 7pm- 7am, please page neurology on call as listed in AMION.

## 2020-11-03 NOTE — Progress Notes (Signed)
Patient reevaluated, she is now speaking, though she still has some expressive aphasia.  She is able to tell me that she has a history of seizures, and recently ran out of her Keppra.  She typically takes 1 g twice daily, but ran out.  Apparently she has another chart that is in the process of being merged.   This likely represents seizure in the setting of medication noncompliance, and she should resume her Keppra 1 g twice daily.  Ritta Slot, MD [facility] [phone]  If 7pm- 7am, please page neurology on call as listed in AMION.

## 2020-11-03 NOTE — H&P (Addendum)
History and Physical    Robin Mosley XLK:[id number] DOB: [birth year] DOA: 11/03/2020  Referring MD/NP/PA:   PCP: Patient, No Pcp Per   Patient coming from:  The patient is coming from home.  At baseline, pt is independent for most of ADL.        Chief Complaint: AMS and seizure  HPI: Robin Mosley is a 65 y.o. female with medical history significant of HTN, HLD, tobacco abuse and stroke, who presents with AMS and seizure.  Pt states that she started feeling a little off since this AM, but she could not tell exactly she started feeling abnormal. She has confusion. She states that she just feels like she cannot think clearly today.   Per ED RN report, pt had a witnessed seizure while in CT.  5mg  IV Versed was given and loaded with  3 g of Keppra per neurologist order. Pt seizure activity stopped after medication administration.   When I saw patient in the ED, she is somnolent and confused.  She talks very slowly.  She knows her own name and orientated to the place, but she is not orientated to the time. She states that she may have mild right arm weakness, but she moves all extremities normally.  No facial droop.  She denies chest pain, cough, shortness breath.  No active nausea, vomiting, diarrhea or abdominal pain.  Denies symptoms of UTI.  She states that she is taking aspirin, blood pressure medications and cholesterol medications.  She also reports that she had a previous stroke but cannot really tell if she had weakness from that before.  She states that she smokes cigarettes, not drinking alcohol and not using drugs.  She states that she has a heart monitor in place.  ED Course: pt was found to have WBC 6.1, pending COVID-19 PCR, pending ethanol level, negative urinalysis, electrolytes renal function okay, temperature normal, blood pressure 116/48, heart rate 89, RR 20, oxygen saturation 96%.  CT head negative.  CT angiogram of head and neck is negative for LVO.  CT of cerebral perfusion  negative for infarction.  Patient is placed on MedSurg bed for obs. Dr. of neurology is consulted.   Review of Systems:   General: no fevers, chills, no body weight gain, has fatigue HEENT: no blurry vision, hearing changes or sore throat Respiratory: no dyspnea, coughing, wheezing CV: no chest pain, no palpitations GI: no nausea, vomiting, abdominal pain, diarrhea, constipation GU: no dysuria, burning on urination, increased urinary frequency, hematuria  Ext: no leg edema Neuro: has possible right arm unilateral weakness, no  numbness, or tingling, no vision change or hearing loss. Has AMS and seizure Skin: no rash, no skin tear. MSK: No muscle spasm, no deformity, no limitation of range of movement in spin Heme: No easy bruising.  Travel history: No recent long distant travel.  Allergy:  Allergies  Allergen Reactions  . Elemental Sulfur   . Sulfonylureas   . Tetracyclines & Related     Past Medical History:  Diagnosis Date  . HLD (hyperlipidemia)   . HTN (hypertension)   . Stroke (HCC)   . Tobacco abuse     Past Surgical History:  Procedure Laterality Date  . CESAREAN SECTION      Social History:  reports that she has been smoking. She has never used smokeless tobacco. She reports that she does not drink alcohol and does not use drugs.  Family History:  Family History  Problem Relation Age of Onset  .  Hypertension Mother   . Hypertension Sister      Prior to Admission medications   Not on File    Physical Exam: Vitals:   11/03/20 1058 11/03/20 1300 11/03/20 1516  BP:  (!) 116/48 (!) 131/52  Pulse:  89 75  Resp:  20 17  Temp: 98.3 F (36.8 C)  97.8 F (36.6 C)  TempSrc: Oral  Oral  SpO2:  96% 98%   General: Not in acute distress HEENT:       Eyes: PERRL, EOMI, no scleral icterus.       ENT: No discharge from the ears and nose, no pharynx injection, no tonsillar enlargement.        Neck: No JVD, no bruit, no mass felt. Heme: No neck  lymph node enlargement. Cardiac: S1/S2, RRR, No murmurs, No gallops or rubs. Respiratory: No rales, wheezing, rhonchi or rubs. GI: Soft, nondistended, nontender, no rebound pain, no organomegaly, BS present. GU: No hematuria Ext: No pitting leg edema bilaterally. 1+DP/PT pulse bilaterally. Musculoskeletal: No joint deformities, No joint redness or warmth, no limitation of ROM in spin. Skin: No rashes.  Neuro: Alert, oriented X3, cranial nerves II-XII grossly intact, moves all extremities normally. Muscle strength 5/5 in all extremities, sensation to light touch intact. Brachial reflex 2+ bilaterally. Knee reflex 1+ bilaterally. Negative Babinski's sign. Normal finger to nose test. Psych: Patient is not psychotic, no suicidal or hemocidal ideation.  Labs on Admission: I have personally reviewed following labs and imaging studies  CBC: Recent Labs  Lab 11/03/20 1104  WBC 6.1  HGB 13.8  HCT 41.3  MCV 84.6  PLT 304   Basic Metabolic Panel: Recent Labs  Lab 11/03/20 1104  NA 138  K 3.9  CL 101  CO2 27  GLUCOSE 96  BUN 12  CREATININE 0.85  CALCIUM 9.0   GFR: CrCl cannot be calculated (Unknown ideal weight.). Liver Function Tests: Recent Labs  Lab 11/03/20 1104  AST 18  ALT 18  ALKPHOS 108  BILITOT 0.6  PROT 8.2*  ALBUMIN 4.2   No results for input(s): LIPASE, AMYLASE in the last 168 hours. No results for input(s): AMMONIA in the last 168 hours. Coagulation Profile: No results for input(s): INR, PROTIME in the last 168 hours. Cardiac Enzymes: No results for input(s): CKTOTAL, CKMB, CKMBINDEX, TROPONINI in the last 168 hours. BNP (last 3 results) No results for input(s): PROBNP in the last 8760 hours. HbA1C: No results for input(s): HGBA1C in the last 72 hours. CBG: No results for input(s): GLUCAP in the last 168 hours. Lipid Profile: No results for input(s): CHOL, HDL, LDLCALC, TRIG, CHOLHDL, LDLDIRECT in the last 72 hours. Thyroid Function Tests: No results  for input(s): TSH, T4TOTAL, FREET4, T3FREE, THYROIDAB in the last 72 hours. Anemia Panel: No results for input(s): VITAMINB12, FOLATE, FERRITIN, TIBC, IRON, RETICCTPCT in the last 72 hours. Urine analysis:    Component Value Date/Time   COLORURINE STRAW (A) 11/03/2020 1254   APPEARANCEUR CLEAR (A) 11/03/2020 1254   LABSPEC 1.029 11/03/2020 1254   PHURINE 5.0 11/03/2020 1254   GLUCOSEU NEGATIVE 11/03/2020 1254   HGBUR MODERATE (A) 11/03/2020 1254   BILIRUBINUR NEGATIVE 11/03/2020 1254   KETONESUR NEGATIVE 11/03/2020 1254   PROTEINUR NEGATIVE 11/03/2020 1254   NITRITE NEGATIVE 11/03/2020 1254   LEUKOCYTESUR NEGATIVE 11/03/2020 1254   Sepsis Labs: (procalcitonin:4,lacticidven:4) ) Recent Results (from the past 240 hour(s))  Resp Panel by RT-PCR (Flu A&B, Covid) Nasopharyngeal Swab     Status: None   Collection Time:  11/03/20 11:45 AM   Specimen: Nasopharyngeal Swab; Nasopharyngeal(NP) swabs in vial transport medium  Result Value Ref Range Status   SARS Coronavirus 2 by RT PCR NEGATIVE NEGATIVE Final    Comment: (NOTE) SARS-CoV-2 target nucleic acids are NOT DETECTED.  The SARS-CoV-2 RNA is generally detectable in upper respiratory specimens during the acute phase of infection. The lowest concentration of SARS-CoV-2 viral copies this assay can detect is 138 copies/mL. A negative result does not preclude SARS-Cov-2 infection and should not be used as the sole basis for treatment or other patient management decisions. A negative result may occur with  improper specimen collection/handling, submission of specimen other than nasopharyngeal swab, presence of viral mutation(s) within the areas targeted by this assay, and inadequate number of viral copies(<138 copies/mL). A negative result must be combined with clinical observations, patient history, and epidemiological information. The expected result is Negative.  Fact Sheet for Patients:   BloggerCourse.com  Fact Sheet for Healthcare Providers:  SeriousBroker.it  This test is no t yet approved or cleared by the [place] FDA and  has been authorized for detection and/or diagnosis of SARS-CoV-2 by FDA under an Emergency Use Authorization (EUA). This EUA will remain  in effect (meaning this test can be used) for the duration of the COVID-19 declaration under Section 564(b)(1) of the Act, 21 U.S.C.section 360bbb-3(b)(1), unless the authorization is terminated  or revoked sooner.       Influenza A by PCR NEGATIVE NEGATIVE Final   Influenza B by PCR NEGATIVE NEGATIVE Final    Comment: (NOTE) The Xpert Xpress SARS-CoV-2/FLU/RSV plus assay is intended as an aid in the diagnosis of influenza from Nasopharyngeal swab specimens and should not be used as a sole basis for treatment. Nasal washings and aspirates are unacceptable for Xpert Xpress SARS-CoV-2/FLU/RSV testing.  Fact Sheet for Patients: BloggerCourse.com  Fact Sheet for Healthcare Providers: SeriousBroker.it  This test is not yet approved or cleared by the [place] FDA and has been authorized for detection and/or diagnosis of SARS-CoV-2 by FDA under an Emergency Use Authorization (EUA). This EUA will remain in effect (meaning this test can be used) for the duration of the COVID-19 declaration under Section 564(b)(1) of the Act, 21 U.S.C. section 360bbb-3(b)(1), unless the authorization is terminated or revoked.  Performed at [facility], [address] [place], [place] [address]      Radiological Exams on Admission: CT [facility] HEAD NECK W WO CONTRAST  Result Date: 11/03/2020 CLINICAL DATA:  Code stroke.  Altered mental status and aphasia EXAM: CT HEAD WITHOUT CONTRAST CT ANGIOGRAPHY OF THE HEAD AND NECK TECHNIQUE: Contiguous axial images were obtained from the base of the skull through  the vertex without intravenous contrast. Multidetector CT imaging of the head and neck was performed using the standard protocol during bolus administration of intravenous contrast. Multiplanar CT image reconstructions and MIPs were obtained to evaluate the vascular anatomy. Carotid stenosis measurements (when applicable) are obtained utilizing NASCET criteria, using the distal internal carotid diameter as the denominator. CONTRAST:  23mL OMNIPAQUE IOHEXOL 350 MG/ML SOLN COMPARISON:  None. FINDINGS: CT HEAD Brain: There is no acute intracranial hemorrhage, mass effect, or edema. No acute appearing loss of gray-white differentiation. Chronic infarct present in the left parietotemporal lobes. There is no extra-axial fluid collection. Ventricles and sulci are within normal limits in size and configuration. Vascular: No hyperdense vessel. Skull: Calvarium is unremarkable. Sinuses/Orbits: No acute finding. Other: None. ASPECTS [facility] Stroke Program Early CT Score) - Ganglionic level infarction (caudate,  lentiform nuclei, internal capsule, insula, M1-M3 cortex): 7 - Supraganglionic infarction (M4-M6 cortex): 3 Total score (0-10 with 10 being normal): 10 Review of the MIP images confirms the above findings CTA NECK Aortic arch: Great vessel origins are patent. Right carotid system: Patent. Mixed plaque at the ICA origin causing less than 50% stenosis. Left carotid system: Patent. Minimal noncalcified plaque at the ICA origin without stenosis. Vertebral arteries: Patent and codominant. No stenosis or evidence of dissection. Skeleton: Degenerative changes of the cervical spine. Other neck: Unremarkable. Upper chest: Included upper lungs are clear. Review of the MIP images confirms the above findings CTA HEAD Anterior circulation: Intracranial internal carotid arteries are patent with minor calcified plaque. Middle cerebral arteries are patent. There is high-grade stenosis of a proximal left M2 MCA branch. Posterior  circulation: Intracranial vertebral arteries are patent. Basilar artery is patent. Major cerebellar artery origins are patent. Posterior cerebral arteries are patent. Venous sinuses: Patent as allowed by contrast bolus timing. Review of the MIP images confirms the above findings IMPRESSION: No acute intracranial hemorrhage or evidence of acute infarction. Chronic left parietotemporal infarct. No large vessel occlusion. Plaque at the ICA origins without hemodynamically significant stenosis. 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 These results were called by telephone at the time of interpretation on 11/03/2020 at 12:26 pm to provider Dr. Amada JupiterKirkpatrick, who verbally acknowledged these results. Electronically Signed   By: Guadlupe SpanishPraneil  Patel M.D.   On: 11/03/2020 12:32   CT CEREBRAL PERFUSION W CONTRAST  Result Date: 11/03/2020 CLINICAL DATA:  Code stroke follow-up EXAM: CT PERFUSION BRAIN TECHNIQUE: Multiphase CT imaging of the brain was performed following IV bolus contrast injection. Subsequent parametric perfusion maps were calculated using RAPID software. CONTRAST:  40mL OMNIPAQUE IOHEXOL 350 MG/ML SOLN COMPARISON:  None. FINDINGS: CBF (<30%) Volume: 0mL Perfusion (Tmax>6.0s) volume: 0mL Mismatch Volume: 0mL Infarct Core: 0 mL Infarction Location: None. IMPRESSION: No evidence of core infarction or penumbra. Electronically Signed   By: Guadlupe SpanishPraneil  Patel M.D.   On: 11/03/2020 12:42   CT HEAD CODE STROKE WO CONTRAST  Result Date: 11/03/2020 CLINICAL DATA:  Code stroke.  Altered mental status and aphasia EXAM: CT HEAD WITHOUT CONTRAST CT ANGIOGRAPHY OF THE HEAD AND NECK TECHNIQUE: Contiguous axial images were obtained from the base of the skull through the vertex without intravenous contrast. Multidetector CT imaging of the head and neck was performed using the standard protocol during bolus administration of intravenous contrast. Multiplanar CT image reconstructions and MIPs were obtained to evaluate the  vascular anatomy. Carotid stenosis measurements (when applicable) are obtained utilizing NASCET criteria, using the distal internal carotid diameter as the denominator. CONTRAST:  75mL OMNIPAQUE IOHEXOL 350 MG/ML SOLN COMPARISON:  None. FINDINGS: CT HEAD Brain: There is no acute intracranial hemorrhage, mass effect, or edema. No acute appearing loss of gray-white differentiation. Chronic infarct present in the left parietotemporal lobes. There is no extra-axial fluid collection. Ventricles and sulci are within normal limits in size and configuration. Vascular: No hyperdense vessel. Skull: Calvarium is unremarkable. Sinuses/Orbits: No acute finding. Other: None. ASPECTS ([place] Stroke Program Early CT Score) - Ganglionic level infarction (caudate, lentiform nuclei, internal capsule, insula, M1-M3 cortex): 7 - Supraganglionic infarction (M4-M6 cortex): 3 Total score (0-10 with 10 being normal): 10 Review of the MIP images confirms the above findings CTA NECK Aortic arch: Great vessel origins are patent. Right carotid system: Patent. Mixed plaque at the ICA origin causing less than 50% stenosis. Left carotid system: Patent. Minimal noncalcified plaque at the ICA  origin without stenosis. Vertebral arteries: Patent and codominant. No stenosis or evidence of dissection. Skeleton: Degenerative changes of the cervical spine. Other neck: Unremarkable. Upper chest: Included upper lungs are clear. Review of the MIP images confirms the above findings CTA HEAD Anterior circulation: Intracranial internal carotid arteries are patent with minor calcified plaque. Middle cerebral arteries are patent. There is high-grade stenosis of a proximal left M2 MCA branch. Posterior circulation: Intracranial vertebral arteries are patent. Basilar artery is patent. Major cerebellar artery origins are patent. Posterior cerebral arteries are patent. Venous sinuses: Patent as allowed by contrast bolus timing. Review of the MIP images confirms the  above findings IMPRESSION: No acute intracranial hemorrhage or evidence of acute infarction. Chronic left parietotemporal infarct. No large vessel occlusion. Plaque at the ICA origins without hemodynamically significant stenosis. 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 These results were called by telephone at the time of interpretation on 11/03/2020 at 12:26 pm to provider Dr. Amada Jupiter, who verbally acknowledged these results. Electronically Signed   By: Guadlupe Spanish M.D.   On: 11/03/2020 12:32     EKG: I have personally reviewed.  Sinus rhythm, QTC 432, nonspecific to change   Assessment/Plan Principal Problem:   Seizure (HCC) Active Problems:   Acute metabolic encephalopathy   HTN (hypertension)   HLD (hyperlipidemia)   Stroke (HCC)   Tobacco abuse   Seizure [facility]): Dr. Amada Jupiter of neurology is consulted, suspect seizure as etiology of her symptoms today. Pt was given Versed 5mg  IV  And Keppra 3g in ED.  CT head negative.  CT angiogram of head and neck negative for LVO.  CT of cerebral perfusion negative for infarction.   -place in med-surg bed for obs -will continue Keppra 1 g twice daily per Dr. - EEG when able  -Seizure precaution -When necessary Ativan for seizure  Addendum: Per Dr. Amada Jupiter note, he re-evaluated pt later on. Pt was able to tell me that she has a history of seizures, and recently ran out of her Keppra. She typically takes 1 g twice daily, but ran out.  Acute metabolic encephalopathy: -Frequent neuro check  HTN (hypertension): -IV hydralazine as needed -Amlodipine  HLD (hyperlipidemia): -Crestor  Stroke (HCC) -ASA and Crestor  Tobacco abuse -Nicotine patch         DVT ppx:  SQ Lovenox Code Status: Full code Family Communication:  not done, no family member is at bed side.  I have tried to call her daughter without success.  Could not leave message since mailbox is not set up Disposition Plan:  Anticipate  discharge back to previous environment Consults called: Dr. Alene Mires of neurology Admission status and Level of care: Med-Surg:    for obs    Status is: Observation  The patient remains OBS appropriate and will d/c before 2 midnights.  Dispo: The patient is from: Home              Anticipated d/c is to: Home              Patient currently is not medically stable to d/c.   Difficult to place patient No         Date of Service 11/03/2020    11/05/2020 [facility]   If 7PM-7AM, please contact night-coverage [email or web address] 11/03/2020, 3:58 PM

## 2020-11-03 NOTE — ED Notes (Signed)
ED TO INPATIENT HANDOFF REPORT  ED Nurse Name and Phone #: Durene Cal RN   S Name/Age/Gender Robin Mosley 65 y.o. female Room/Bed: ED02A/ED02A  Code Status   Code Status: Not on file  Home/SNF/Other Home Patient oriented to: self, place and time Is this baseline? Yes   Triage Complete: Triage complete  Chief Complaint Seizures (HCC) [R56.9]  Triage Note Pt presents via EMS c/o increased confusion starting today or yesterday per EMS. Pt has baseline confusion per neighbor that was with pt upon arrival of EMS. Pt alert to person and situation. Unable to give current year which is baseline per EMS. Pt reports "I just dont feel right" hx of CVA and HTN per EMS. Pt denies pain, fever, SOB, headache.     Allergies Allergies  Allergen Reactions  . Elemental Sulfur   . Sulfonylureas   . Tetracyclines & Related     Level of Care/Admitting Diagnosis ED Disposition    ED Disposition Condition Comment   Admit  Hospital Area: [facility] [[id number]]  Level of Care: Med-Surg [16]  Covid Evaluation: Asymptomatic Screening Protocol (No Symptoms)  Diagnosis: Seizures (HCC) [678938]  Admitting Physician: Lorretta Harp [[id number]]  Attending Physician: Lorretta Harp [[id number]]       B Medical/Surgery History Past Medical History:  Diagnosis Date  . HLD (hyperlipidemia)   . HTN (hypertension)   . Stroke (HCC)   . Tobacco abuse    Past Surgical History:  Procedure Laterality Date  . CESAREAN SECTION       A IV Location/Drains/Wounds Patient Lines/Drains/Airways Status    Active Line/Drains/Airways    Name Placement date Placement time Site Days   Peripheral IV 11/03/20 Right Antecubital 11/03/20  1105  Antecubital  less than 1   Peripheral IV 11/03/20 Left Antecubital 11/03/20  1257  Antecubital  less than 1          Intake/Output Last 24 hours No intake or output data in the 24 hours ending 11/03/20 1453  Labs/Imaging Results for orders placed or performed  during the hospital encounter of 11/03/20 (from the past 48 hour(s))  Comprehensive metabolic panel     Status: Abnormal   Collection Time: 11/03/20 11:04 AM  Result Value Ref Range   Sodium 138 135 - 145 mmol/L   Potassium 3.9 3.5 - 5.1 mmol/L   Chloride 101 98 - 111 mmol/L   CO2 27 22 - 32 mmol/L   Glucose, Bld 96 70 - 99 mg/dL    Comment: Glucose reference range applies only to samples taken after fasting for at least 8 hours.   BUN 12 8 - 23 mg/dL   Creatinine, Ser 1.01 0.44 - 1.00 mg/dL   Calcium 9.0 8.9 - 75.1 mg/dL   Total Protein 8.2 (H) 6.5 - 8.1 g/dL   Albumin 4.2 3.5 - 5.0 g/dL   AST 18 15 - 41 U/L   ALT 18 0 - 44 U/L   Alkaline Phosphatase 108 38 - 126 U/L   Total Bilirubin 0.6 0.3 - 1.2 mg/dL   GFR, Estimated >02 >58 mL/min    Comment: (NOTE) Calculated using the CKD-EPI Creatinine Equation (2021)    Anion gap 10 5 - 15    Comment: Performed at [facility], [address] [place], [place] [address]  CBC     Status: None   Collection Time: 11/03/20 11:04 AM  Result Value Ref Range   WBC 6.1 4.0 - 10.5 K/uL   RBC 4.88 3.87 - 5.11 MIL/uL  Hemoglobin 13.8 12.0 - 15.0 g/dL   HCT 79.4 80.1 - 65.5 %   MCV 84.6 80.0 - 100.0 fL   MCH 28.3 26.0 - 34.0 pg   MCHC 33.4 30.0 - 36.0 g/dL   RDW 37.4 82.7 - 07.8 %   Platelets 304 150 - 400 K/uL   nRBC 0.0 0.0 - 0.2 %    Comment: Performed at [facility] [address], [place], [place] [address]  Resp Panel by RT-PCR (Flu A&B, Covid) Nasopharyngeal Swab     Status: None   Collection Time: 11/03/20 11:45 AM   Specimen: Nasopharyngeal Swab; Nasopharyngeal(NP) swabs in vial transport medium  Result Value Ref Range   SARS Coronavirus 2 by RT PCR NEGATIVE NEGATIVE    Comment: (NOTE) SARS-CoV-2 target nucleic acids are NOT DETECTED.  The SARS-CoV-2 RNA is generally detectable in upper respiratory specimens during the acute phase of infection. The lowest concentration of SARS-CoV-2 viral copies this  assay can detect is 138 copies/mL. A negative result does not preclude SARS-Cov-2 infection and should not be used as the sole basis for treatment or other patient management decisions. A negative result may occur with  improper specimen collection/handling, submission of specimen other than nasopharyngeal swab, presence of viral mutation(s) within the areas targeted by this assay, and inadequate number of viral copies(<138 copies/mL). A negative result must be combined with clinical observations, patient history, and epidemiological information. The expected result is Negative.  Fact Sheet for Patients:  BloggerCourse.com  Fact Sheet for Healthcare Providers:  SeriousBroker.it  This test is no t yet approved or cleared by the [place] FDA and  has been authorized for detection and/or diagnosis of SARS-CoV-2 by FDA under an Emergency Use Authorization (EUA). This EUA will remain  in effect (meaning this test can be used) for the duration of the COVID-19 declaration under Section 564(b)(1) of the Act, 21 U.S.C.section 360bbb-3(b)(1), unless the authorization is terminated  or revoked sooner.       Influenza A by PCR NEGATIVE NEGATIVE   Influenza B by PCR NEGATIVE NEGATIVE    Comment: (NOTE) The Xpert Xpress SARS-CoV-2/FLU/RSV plus assay is intended as an aid in the diagnosis of influenza from Nasopharyngeal swab specimens and should not be used as a sole basis for treatment. Nasal washings and aspirates are unacceptable for Xpert Xpress SARS-CoV-2/FLU/RSV testing.  Fact Sheet for Patients: BloggerCourse.com  Fact Sheet for Healthcare Providers: SeriousBroker.it  This test is not yet approved or cleared by the [place] FDA and has been authorized for detection and/or diagnosis of SARS-CoV-2 by FDA under an Emergency Use Authorization (EUA). This EUA will remain in  effect (meaning this test can be used) for the duration of the COVID-19 declaration under Section 564(b)(1) of the Act, 21 U.S.C. section 360bbb-3(b)(1), unless the authorization is terminated or revoked.  Performed at [facility], [address] [place], [place] [address]   Urinalysis, Routine w reflex microscopic Urine, Catheterized     Status: Abnormal   Collection Time: 11/03/20 12:54 PM  Result Value Ref Range   Color, Urine STRAW (A) YELLOW   APPearance CLEAR (A) CLEAR   Specific Gravity, Urine 1.029 1.005 - 1.030   pH 5.0 5.0 - 8.0   Glucose, UA NEGATIVE NEGATIVE mg/dL   Hgb urine dipstick MODERATE (A) NEGATIVE   Bilirubin Urine NEGATIVE NEGATIVE   Ketones, ur NEGATIVE NEGATIVE mg/dL   Protein, ur NEGATIVE NEGATIVE mg/dL   Nitrite NEGATIVE NEGATIVE   Leukocytes,Ua NEGATIVE NEGATIVE  RBC / HPF 0-5 0 - 5 RBC/hpf   WBC, UA NONE SEEN 0 - 5 WBC/hpf   Bacteria, UA NONE SEEN NONE SEEN   Squamous Epithelial / LPF NONE SEEN 0 - 5    Comment: Performed at [facility], [address], [place], [place] [address]   CT [facility] HEAD NECK W WO CONTRAST  Result Date: 11/03/2020 CLINICAL DATA:  Code stroke.  Altered mental status and aphasia EXAM: CT HEAD WITHOUT CONTRAST CT ANGIOGRAPHY OF THE HEAD AND NECK TECHNIQUE: Contiguous axial images were obtained from the base of the skull through the vertex without intravenous contrast. Multidetector CT imaging of the head and neck was performed using the standard protocol during bolus administration of intravenous contrast. Multiplanar CT image reconstructions and MIPs were obtained to evaluate the vascular anatomy. Carotid stenosis measurements (when applicable) are obtained utilizing NASCET criteria, using the distal internal carotid diameter as the denominator. CONTRAST:  75mL OMNIPAQUE IOHEXOL 350 MG/ML SOLN COMPARISON:  None. FINDINGS: CT HEAD Brain: There is no acute intracranial hemorrhage, mass effect, or edema. No  acute appearing loss of gray-white differentiation. Chronic infarct present in the left parietotemporal lobes. There is no extra-axial fluid collection. Ventricles and sulci are within normal limits in size and configuration. Vascular: No hyperdense vessel. Skull: Calvarium is unremarkable. Sinuses/Orbits: No acute finding. Other: None. ASPECTS (Alberta Stroke Program Early CT Score) - Ganglionic level infarction (caudate, lentiform nuclei, internal capsule, insula, M1-M3 cortex): 7 - Supraganglionic infarction (M4-M6 cortex): 3 Total score (0-10 with 10 being normal): 10 Review of the MIP images confirms the above findings CTA NECK Aortic arch: Great vessel origins are patent. Right carotid system: Patent. Mixed plaque at the ICA origin causing less than 50% stenosis. Left carotid system: Patent. Minimal noncalcified plaque at the ICA origin without stenosis. Vertebral arteries: Patent and codominant. No stenosis or evidence of dissection. Skeleton: Degenerative changes of the cervical spine. Other neck: Unremarkable. Upper chest: Included upper lungs are clear. Review of the MIP images confirms the above findings CTA HEAD Anterior circulation: Intracranial internal carotid arteries are patent with minor calcified plaque. Middle cerebral arteries are patent. There is high-grade stenosis of a proximal left M2 MCA branch. Posterior circulation: Intracranial vertebral arteries are patent. Basilar artery is patent. Major cerebellar artery origins are patent. Posterior cerebral arteries are patent. Venous sinuses: Patent as allowed by contrast bolus timing. Review of the MIP images confirms the above findings IMPRESSION: No acute intracranial hemorrhage or evidence of acute infarction. Chronic left parietotemporal infarct. No large vessel occlusion. Plaque at the ICA origins without hemodynamically significant stenosis. 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 These results were called by telephone at the  time of interpretation on 11/03/2020 at 12:26 pm to provider Dr. Amada Jupiter, who verbally acknowledged these results. Electronically Signed   By: Guadlupe Spanish M.D.   On: 11/03/2020 12:32   CT CEREBRAL PERFUSION W CONTRAST  Result Date: 11/03/2020 CLINICAL DATA:  Code stroke follow-up EXAM: CT PERFUSION BRAIN TECHNIQUE: Multiphase CT imaging of the brain was performed following IV bolus contrast injection. Subsequent parametric perfusion maps were calculated using RAPID software. CONTRAST:  40mL OMNIPAQUE IOHEXOL 350 MG/ML SOLN COMPARISON:  None. FINDINGS: CBF (<30%) Volume: 0mL Perfusion (Tmax>6.0s) volume: 0mL Mismatch Volume: 0mL Infarct Core: 0 mL Infarction Location: None. IMPRESSION: No evidence of core infarction or penumbra. Electronically Signed   By: Guadlupe Spanish M.D.   On: 11/03/2020 12:42   CT HEAD CODE STROKE WO CONTRAST  Result Date:  11/03/2020 CLINICAL DATA:  Code stroke.  Altered mental status and aphasia EXAM: CT HEAD WITHOUT CONTRAST CT ANGIOGRAPHY OF THE HEAD AND NECK TECHNIQUE: Contiguous axial images were obtained from the base of the skull through the vertex without intravenous contrast. Multidetector CT imaging of the head and neck was performed using the standard protocol during bolus administration of intravenous contrast. Multiplanar CT image reconstructions and MIPs were obtained to evaluate the vascular anatomy. Carotid stenosis measurements (when applicable) are obtained utilizing NASCET criteria, using the distal internal carotid diameter as the denominator. CONTRAST:  54mL OMNIPAQUE IOHEXOL 350 MG/ML SOLN COMPARISON:  None. FINDINGS: CT HEAD Brain: There is no acute intracranial hemorrhage, mass effect, or edema. No acute appearing loss of gray-white differentiation. Chronic infarct present in the left parietotemporal lobes. There is no extra-axial fluid collection. Ventricles and sulci are within normal limits in size and configuration. Vascular: No hyperdense vessel.  Skull: Calvarium is unremarkable. Sinuses/Orbits: No acute finding. Other: None. ASPECTS (Alberta Stroke Program Early CT Score) - Ganglionic level infarction (caudate, lentiform nuclei, internal capsule, insula, M1-M3 cortex): 7 - Supraganglionic infarction (M4-M6 cortex): 3 Total score (0-10 with 10 being normal): 10 Review of the MIP images confirms the above findings CTA NECK Aortic arch: Great vessel origins are patent. Right carotid system: Patent. Mixed plaque at the ICA origin causing less than 50% stenosis. Left carotid system: Patent. Minimal noncalcified plaque at the ICA origin without stenosis. Vertebral arteries: Patent and codominant. No stenosis or evidence of dissection. Skeleton: Degenerative changes of the cervical spine. Other neck: Unremarkable. Upper chest: Included upper lungs are clear. Review of the MIP images confirms the above findings CTA HEAD Anterior circulation: Intracranial internal carotid arteries are patent with minor calcified plaque. Middle cerebral arteries are patent. There is high-grade stenosis of a proximal left M2 MCA branch. Posterior circulation: Intracranial vertebral arteries are patent. Basilar artery is patent. Major cerebellar artery origins are patent. Posterior cerebral arteries are patent. Venous sinuses: Patent as allowed by contrast bolus timing. Review of the MIP images confirms the above findings IMPRESSION: No acute intracranial hemorrhage or evidence of acute infarction. Chronic left parietotemporal infarct. No large vessel occlusion. Plaque at the ICA origins without hemodynamically significant stenosis. 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 These results were called by telephone at the time of interpretation on 11/03/2020 at 12:26 pm to provider Dr. Amada Jupiter, who verbally acknowledged these results. Electronically Signed   By: Guadlupe Spanish M.D.   On: 11/03/2020 12:32    Pending Labs Unresulted Labs (From admission, onward)           Start     Ordered   11/03/20 1145  Ethanol  ONCE - STAT,   STAT        11/03/20 1144   11/03/20 1145  Protime-INR  (Stroke Panel)  ONCE - STAT,   STAT        11/03/20 1144   11/03/20 1145  APTT  (Stroke Panel)  ONCE - STAT,   STAT        11/03/20 1144   11/03/20 1145  Differential  (Stroke Panel)  ONCE - STAT,   STAT        11/03/20 1144   11/03/20 1145  Comprehensive metabolic panel  (Stroke Panel)  ONCE - STAT,   STAT        11/03/20 1144   11/03/20 1145  Urine Drug Screen, Qualitative  Once,   STAT        11/03/20 1144  Signed and Held  HIV Antibody (routine testing w rflx)  (HIV Antibody (Routine testing w reflex) panel)  Once,   R        Signed and Held   Signed and Held  Basic metabolic panel  Tomorrow morning,   R        Signed and Held   Signed and Held  CBC  Tomorrow morning,   R        Signed and Held          Vitals/Pain Today's Vitals   11/03/20 1058 11/03/20 1101 11/03/20 1300  BP:   (!) 116/48  Pulse:   89  Resp:   20  Temp: 98.3 F (36.8 C)    TempSrc: Oral    SpO2:   96%  PainSc:  0-No pain     Isolation Precautions No active isolations  Medications Medications  LORazepam (ATIVAN) injection 2 mg (has no administration in time range)  levETIRAcetam (KEPPRA) IVPB 1000 mg/100 mL premix (has no administration in time range)  0.9 %  sodium chloride infusion (has no administration in time range)  nicotine (NICODERM CQ - dosed in mg/24 hours) patch 21 mg (has no administration in time range)  aspirin EC tablet 81 mg (has no administration in time range)  iohexol (OMNIPAQUE) 350 MG/ML injection 75 mL (75 mLs Intravenous Contrast Given 11/03/20 1214)  levETIRAcetam (KEPPRA) 3,000 mg in sodium chloride 0.9 % 250 mL IVPB (0 mg Intravenous Stopped 11/03/20 1315)  iohexol (OMNIPAQUE) 350 MG/ML injection 40 mL (40 mLs Intravenous Contrast Given 11/03/20 1232)  midazolam (VERSED) 5 MG/5ML injection 5 mg (5 mg Intravenous Given 11/03/20 1231)    Mobility walks High  fall risk   Focused Assessments Neuro Assessment Handoff:  Swallow screen pass? No    NIH Stroke Scale ( + Modified Stroke Scale Criteria)  Interval: Initial Level of Consciousness (1a.)   : Alert, keenly responsive LOC Questions (1b. )   +: Answers both questions correctly LOC Commands (1c. )   + : Performs both tasks correctly Best Gaze (2. )  +: Normal Visual (3. )  +: No visual loss Facial Palsy (4. )    : Normal symmetrical movements Motor Arm, Left (5a. )   +: No drift Motor Arm, Right (5b. )   +: Drift Motor Leg, Left (6a. )   +: No drift Motor Leg, Right (6b. )   +: Drift Limb Ataxia (7. ): Absent Sensory (8. )   +: Normal, no sensory loss Best Language (9. )   +: No aphasia Dysarthria (10. ): Mild-to-moderate dysarthria, patient slurs at least some words and, at worst, can be understood with some difficulty Extinction/Inattention (11.)   +: No Abnormality Modified SS Total  +: 2 Complete NIHSS TOTAL: 3 Last date known well: 11/02/20 Last time known well:  (unknown) Neuro Assessment: [facility] Checks:   Initial (11/03/20 1157)  Last Documented NIHSS Modified Score: 2 (11/03/20 1157) Has TPA been given? No If patient is a Neuro Trauma and patient is going to OR before floor call report to 4N Charge nurse: [phone] or [phone]     R Recommendations: See Admitting Provider Note  Report given to:   Additional Notes: n/a

## 2020-11-03 NOTE — ED Triage Notes (Signed)
Pt presents via EMS c/o increased confusion starting today or yesterday per EMS. Pt has baseline confusion per neighbor that was with pt upon arrival of EMS. Pt alert to person and situation. Unable to give current year which is baseline per EMS. Pt reports "I just dont feel right" hx of CVA and HTN per EMS. Pt denies pain, fever, SOB, headache.

## 2020-11-03 NOTE — Consult Note (Signed)
CODE STROKE- PHARMACY COMMUNICATION   Time CODE STROKE called/page received:[id number]  Time response to CODE STROKE was made (in person or via phone): in person  Time Stroke Kit retrieved from [facility] (only if needed): N/A  Name of Provider/Nurse contacted: EDP Quale and neurologist Kirkpatrick  No past medical history on file. Prior to Admission medications   Not on File   Benn Moulder, PharmD Pharmacy Resident  11/03/2020 12:53 PM

## 2020-11-03 NOTE — ED Notes (Signed)
Tele neuro cart at bedside. Activated per Fanny Bien MD

## 2020-11-04 ENCOUNTER — Other Ambulatory Visit: Payer: Self-pay | Admitting: Internal Medicine

## 2020-11-04 ENCOUNTER — Ambulatory Visit (INDEPENDENT_AMBULATORY_CARE_PROVIDER_SITE_OTHER): Payer: Self-pay

## 2020-11-04 DIAGNOSIS — I639 Cerebral infarction, unspecified: Secondary | ICD-10-CM

## 2020-11-04 LAB — CBC
HCT: 35.7 % — ABNORMAL LOW (ref 36.0–46.0)
Hemoglobin: 12 g/dL (ref 12.0–15.0)
MCH: 28.4 pg (ref 26.0–34.0)
MCHC: 33.6 g/dL (ref 30.0–36.0)
MCV: 84.6 fL (ref 80.0–100.0)
Platelets: 291 10*3/uL (ref 150–400)
RBC: 4.22 MIL/uL (ref 3.87–5.11)
RDW: 13.5 % (ref 11.5–15.5)
WBC: 6.4 10*3/uL (ref 4.0–10.5)
nRBC: 0 % (ref 0.0–0.2)

## 2020-11-04 LAB — BASIC METABOLIC PANEL
Anion gap: 7 (ref 5–15)
BUN: 11 mg/dL (ref 8–23)
CO2: 27 mmol/L (ref 22–32)
Calcium: 8.5 mg/dL — ABNORMAL LOW (ref 8.9–10.3)
Chloride: 106 mmol/L (ref 98–111)
Creatinine, Ser: 0.76 mg/dL (ref 0.44–1.00)
GFR, Estimated: >60 mL/min (ref >60)
Glucose, Bld: 97 mg/dL (ref 70–99)
Potassium: 3.5 mmol/L (ref 3.5–5.1)
Sodium: 140 mmol/L (ref 135–145)

## 2020-11-04 LAB — HIV ANTIBODY (ROUTINE TESTING W REFLEX): HIV Screen 4th Generation wRfx: NONREACTIVE

## 2020-11-04 LAB — CK: Total CK: 76 U/L (ref 38–234)

## 2020-11-04 MED ORDER — AMLODIPINE BESYLATE 5 MG PO TABS: 5.0000 mg | ORAL_TABLET | Freq: Every day | ORAL | 3 refills | Status: DC

## 2020-11-04 MED ORDER — ROSUVASTATIN CALCIUM 40 MG PO TABS: 40.0000 mg | ORAL_TABLET | Freq: Every day | ORAL | 3 refills | Status: DC

## 2020-11-04 MED ORDER — ASPIRIN EC 81 MG PO TBEC: 81.0000 mg | DELAYED_RELEASE_TABLET | Freq: Every day | ORAL | 2 refills | Status: AC

## 2020-11-04 MED ORDER — LEVETIRACETAM 1000 MG PO TABS: 1000.0000 mg | ORAL_TABLET | Freq: Two times a day (BID) | ORAL | 6 refills | Status: AC

## 2020-11-04 MED ORDER — LEVETIRACETAM 500 MG PO TABS
1000.0000 mg | ORAL_TABLET | Freq: Two times a day (BID) | ORAL | Status: DC
  Administered 2020-11-04: 1000 mg via ORAL
  Filled 2020-11-04 (×2): qty 2

## 2020-11-04 MED ORDER — POTASSIUM CHLORIDE CRYS ER 20 MEQ PO TBCR
40.0000 meq | EXTENDED_RELEASE_TABLET | Freq: Once | ORAL | Status: AC
  Administered 2020-11-04: 40 meq via ORAL
  Filled 2020-11-04: qty 2

## 2020-11-04 NOTE — Progress Notes (Signed)
Neurology Progress Note  Patient ID: Robin Mosley is a 65 y.o. with PMHx of  has a past medical history of HLD (hyperlipidemia), HTN (hypertension), Stroke (HCC), and Tobacco abuse.  And post stroke epilepsy  Initially consulted for: Code stroke  Subjective: -Feels back to her post stroke baseline -Expresses significant distress about her residual post stroke deficits -Reports she was running out of her Keppra and therefore was taking 500 mg once nightly instead of thousand twice a day.  Reports that she was lectured by her neurologist to take the Keppra 1000 mg twice daily as prescribed but she had not been able to get additional medication -Concerned about blood when she wipes after using the bathroom  Exam: Vitals:   11/04/20 0842 11/04/20 1152  BP: 133/70 139/61  Pulse: 73 62  Resp: 16 16  Temp: 98.9 F (37.2 C) 98.3 F (36.8 C)  SpO2: 97% 96%   Gen: In bed, comfortable  Resp: non-labored breathing, no grossly audible wheezing Cardiac: Perfusing extremities well  Abd: soft, nt  Neuro: MS: Awake, alert, notable hesitancy in her speech with effortful speech, word finding difficulty, and occasional word substitutions.  Able to name some simple things. CN: Pupils equal round reactive, face with a mild right facial droop, tongue midline Motor: No pronator drift bilaterally.  Right lower extremity 3/5 which she reports is her baseline.  Left lower extremity 5/5 throughout Sensory: Reduced on the right arm and leg which she reports is her baseline  Pertinent Labs: CK normal at 76 Creatinine stable at 0.76  Impression: Breakthrough seizures in the setting of medication nonadherence, now improving.  Recommendations: -Continue Keppra 1000 mg twice daily; continue counseling on medication compliance -Evaluation of GU bleeding per primary team -Psychosocial support as possible; appreciate social work team assistance.  She reports good support from her neighbors, but lives  alone. -Neurology will be available on an as-needed basis going forward.  Please reconsult if new questions arise  Brooke Dare MD-PhD [facility] [phone]   A total of 38 minutes were spent in care of this patient today, over 50% of which was in direct patient/family contact.  Given aphasia and patient's distress and her poststroke deficits, an extended period of time was spent with her at bedside

## 2020-11-04 NOTE — Plan of Care (Signed)
Patient is being discharged home to self care. A friend will be her transportation home. MD notified daughter. IV removed, discharge instructions given and patient educated on follow up care, and medications, where to pick up prescriptions.

## 2020-11-04 NOTE — Progress Notes (Signed)
Met with the patient for discharge plan and needs She lives at home in an apartment alone, she has not been complaint with her seizure medication, she does not have insurance She is in the process of applying for Disability She will need to get her medications at [facility], she was [facility] and a referral was sent to them CM requested a charity RW to be provided to the patient by [employer] Will request [facility] once she is medically stable to DC, She stated that her neighbors provide her transportation to the Doctor

## 2020-11-04 NOTE — Progress Notes (Signed)
OT Cancellation Note  Patient Details Name: Robin Mosley MRN: [id number] DOB: 05/11/1956   Cancelled Treatment:    Reason Eval/Treat Not Completed: Other (comment). Consult received, chart reviewed. Pt noted to be speaking with RN. Unavailable for OT eval. Will re-attempt as pt is available.  Wynona Canes, MPH, MS, OTR/L ascom [phone] 11/04/20, 2:55 PM

## 2020-11-04 NOTE — Progress Notes (Signed)
Student nurse assisted patient to void this morning. Increase red discharge was noted on toilet paper not in urine.

## 2020-11-04 NOTE — Evaluation (Signed)
Physical Therapy Evaluation Patient Details Name: Robin Mosley MRN: [id number] DOB: 1955-08-15 Today's Date: 11/04/2020   History of Present Illness  Pt is a 65 y/o F admitted from home on 11/03/20 with c/c feeling "a little off" and "cannot think clearly". Per ED RN report, pt had a witnessed seizure while in CT & seizure activity stopped after medication administration. Imaging negative. Of note, pt later told MD she has a hx of seizures & recently ran out of Keppra. PMH: HTN, HLD, tobacco abuse, stroke, heart monitor, seizures  Clinical Impression  Pt seen for PT evaluation with pt initially requiring supervision for gait without AD in room to bathroom, progressing to mod I when ambulating 1 lap around nurses station. Pt is able to perform toileting without assistance for peri hygiene. Spent significant time discussing pt's home situation with pt noting she lives alone (family in [place]) but neighbors will assist her to appointments PRN. Pt notes difficulty affording medications & case manager & MD notified; also provided pt with webside ([email or web address]) in case pt needs other resources. Pt would benefit from HHPT f/u for high level balance training & safety with mobility. Will continue to see pt in acute setting to address deficits noted & to practice stair negotiation as able.     Follow Up Recommendations Home health PT;Supervision - Intermittent    Equipment Recommendations  Rolling walker with 5" wheels    Recommendations for Other Services       Precautions / Restrictions Precautions Precautions: None Restrictions Weight Bearing Restrictions: No      Mobility  Bed Mobility Overal bed mobility: Modified Independent Bed Mobility: Supine to Sit;Sit to Supine     Supine to sit: Modified independent (Device/Increase time) Sit to supine: Modified independent (Device/Increase time)   General bed mobility comments: HOB elevated    Transfers   Equipment used: None              General transfer comment: supervision first transfer, progressing to mod I at end of session  Ambulation/Gait Ambulation/Gait assistance: Modified independent (Device/Increase time) Gait Distance (Feet): 200 Feet Assistive device: None Gait Pattern/deviations: Decreased step length - right;Decreased dorsiflexion - right     General Gait Details: 1 slight LOB to L when turning but pt able to self correct  Stairs            Wheelchair Mobility    Modified Rankin (Stroke Patients Only)       Balance Overall balance assessment: Modified Independent                                           Pertinent Vitals/Pain Pain Assessment: No/denies pain    Home Living Family/patient expects to be discharged to:: Private residence Living Arrangements: Alone Available Help at Discharge:  (neighbors occasionally) Type of Home:  (townhome) Home Access: Stairs to enter (single step threshold) Entrance Stairs-Rails: Can reach both Entrance Stairs-Number of Steps: 1 Home Layout: Multi-level        Prior Function Level of Independence: Independent         Comments: denies falls in the past, reports she's driving to [employer] PRN but knows she shouldn't drive, otherwise neighbors take her to appointments, lives alone (Family in [place])     Higher education careers adviser        Extremity/Trunk Assessment   Upper Extremity Assessment Upper Extremity Assessment:  (RUE impaired coordination  but reports this is preexisting since CVA, no noticable change in strength compared to 1 week ago)    Lower Extremity Assessment Lower Extremity Assessment:  (hx of RLE impaired coordination 2/2 since prior CVA, no noticable weakness/change in strength compared to 1 week ago)       Communication   Communication: Expressive difficulties (reports this is preexisting since CVA)  Cognition Arousal/Alertness: Awake/alert Behavior During Therapy: WFL for tasks  assessed/performed Overall Cognitive Status: Within Functional Limits for tasks assessed                                 General Comments: AxOx4      General Comments General comments (skin integrity, edema, etc.): assisted pt to bathroom where she had continent void & performed peri hygiene without assistance (observed trace red/pink blood on tissue - nurse made aware)    Exercises     Assessment/Plan    PT Assessment Patient needs continued PT services  PT Problem List Decreased strength;Decreased mobility;Decreased coordination;Decreased balance       PT Treatment Interventions DME instruction;Therapeutic activities;Gait training;Therapeutic exercise;Patient/family education;Stair training;Balance training;Functional mobility training;Neuromuscular re-education;Manual techniques    PT Goals (Current goals can be found in the Care Plan section)  Acute Rehab PT Goals Patient Stated Goal: go home PT Goal Formulation: With patient Time For Goal Achievement: 11/18/20 Potential to Achieve Goals: Good    Frequency Min 2X/week   Barriers to discharge Decreased caregiver support lives alone, no one to provide supervision/assist    Co-evaluation               AM-PAC PT "6 Clicks" Mobility  Outcome Measure Help needed turning from your back to your side while in a flat bed without using bedrails?: None Help needed moving from lying on your back to sitting on the side of a flat bed without using bedrails?: None Help needed moving to and from a bed to a chair (including a wheelchair)?: None Help needed standing up from a chair using your arms (e.g., wheelchair or bedside chair)?: None Help needed to walk in hospital room?: None Help needed climbing 3-5 steps with a railing? : A Little 6 Click Score: 23    End of Session Equipment Utilized During Treatment: Gait belt Activity Tolerance: Patient tolerated treatment well Patient left: in chair;with chair alarm  set;with call bell/phone within reach;with nursing/sitter in room Nurse Communication: Mobility status (IV bleeding, skin tear on R knee 2/2 hx of psoriasis causing pt to bleed) PT Visit Diagnosis: Other abnormalities of gait and mobility (R26.89);Muscle weakness (generalized) (M62.81)    Time: 5643-3295 PT Time Calculation (min) (ACUTE ONLY): 33 min   Charges:   PT Evaluation $PT Eval Low Complexity: 1 Low PT Treatments $Therapeutic Activity: 23-37 mins        Aleda Grana, PT, DPT 11/04/20, 1:35 PM   Sandi Mariscal 11/04/2020, 1:33 PM

## 2020-11-04 NOTE — Discharge Summary (Signed)
Physician Discharge Summary  Robin Mosley ZOX:[id number] DOB: [birth year] DOA: 11/03/2020  PCP: Patient, No Pcp Per  Admit date: 11/03/2020 Discharge date: 11/04/2020  Admitted From: Home  Disposition:  Home   Recommendations for Outpatient Follow-up:  1. Follow up with PCP in 1-2 weeks 2. Please obtain BMP/CBC in one week 3. Might need referral to GYN if vaginal bleed persist.  4. Needs counseling and assistance with medications.   Home Health: yes.   Discharge Condition: Stable.  CODE STATUS: Full code Diet recommendation: Heart Healthy   Brief/Interim Summary perform By Dr Awilda Metro Towell is a 65 y.o. female with medical history significant of HTN, HLD, tobacco abuse and stroke, who presents with AMS and seizure.  Pt states that she started feeling a little off since this AM, but she could not tell exactly she started feeling abnormal. She has confusion. She states that she just feels like she cannot think clearly today.   Per ED RN report, pt had a witnessed seizure while in CT.   IV Versed was given and loaded with  3 g of Keppra per neurologist order. Pt seizure activity stopped after medication administration.   When I saw patient in the ED, she is somnolent and confused.  She talks very slowly.  She knows her own name and orientated to the place, but she is not orientated to the time. She states that she may have mild right arm weakness, but she moves all extremities normally.  No facial droop.  She denies chest pain, cough, shortness breath.  No active nausea, vomiting, diarrhea or abdominal pain.  Denies symptoms of UTI.  She states that she is taking aspirin, blood pressure medications and cholesterol medications.  She also reports that she had a previous stroke but cannot really tell if she had weakness from that before.  She states that she smokes cigarettes, not drinking alcohol and not using drugs.  She states that she has a heart monitor in place.  ED Course: pt  was found to have WBC 6.1, pending COVID-19 PCR, pending ethanol level, negative urinalysis, electrolytes renal function okay, temperature normal, blood pressure 116/48, heart rate 89, RR 20, oxygen saturation 96%.  CT head negative.  CT angiogram of head and neck is negative for LVO.  CT of cerebral perfusion negative for infarction.  Patient is placed on MedSurg bed for obs. Dr. Amada Jupiter of neurology is consulted.  1-Seizure; secundary to non compliance with meds.  Patient was restarted on Keppra 1 gr BID>  She is back to baseline.  CTA head no significant large vessel occlusion.  Medication send to Med management.   2-HTN; continue with Norvasc.  3-History of stroke: continue with aspirin and lipitor.  -Of note patient needs assistance for any written instruction and with answering any formulary questions.   4-Episode of vaginal Bleed;  Will need GYN referral.    Discharge Diagnoses:  Principal Problem:   Seizure (HCC) Active Problems:   Acute metabolic encephalopathy   HTN (hypertension)   HLD (hyperlipidemia)   Stroke [facility])   Tobacco abuse    Discharge Instructions  Discharge Instructions    Diet - low sodium heart healthy   Complete by: As directed    Increase activity slowly   Complete by: As directed      Allergies as of 11/04/2020      Reactions   Elemental Sulfur    Sulfonylureas    Tetracyclines & Related       Medication List  TAKE these medications   amLODipine 5 MG tablet Commonly known as: NORVASC Take 1 tablet (5 mg total) by mouth daily. Start taking on: November 05, 2020   aspirin EC 81 MG tablet Take 1 tablet (81 mg total) by mouth daily. Swallow whole.   levETIRAcetam 1000 MG tablet Commonly known as: KEPPRA Take 1 tablet (1,000 mg total) by mouth 2 (two) times daily.   rosuvastatin 40 MG tablet Commonly known as: CRESTOR Take 1 tablet (40 mg total) by mouth daily. Start taking on: November 05, 2020            Durable Medical  Equipment  (From admission, onward)         Start     Ordered   11/04/20 1113  For home use only DME 4 wheeled rolling walker with seat  Once       Question:  Patient needs a walker to treat with the following condition  Answer:  Weakness   11/04/20 1113          Allergies  Allergen Reactions  . Elemental Sulfur   . Sulfonylureas   . Tetracyclines & Related     Consultations:  Neurology    Procedures/Studies: CT ANGIOGRAM HEAD NECK W WO CONTRAST  Result Date: 11/03/2020 CLINICAL DATA:  Code stroke.  Altered mental status and aphasia EXAM: CT HEAD WITHOUT CONTRAST CT ANGIOGRAPHY OF THE HEAD AND NECK TECHNIQUE: Contiguous axial images were obtained from the base of the skull through the vertex without intravenous contrast. Multidetector CT imaging of the head and neck was performed using the standard protocol during bolus administration of intravenous contrast. Multiplanar CT image reconstructions and MIPs were obtained to evaluate the vascular anatomy. Carotid stenosis measurements (when applicable) are obtained utilizing NASCET criteria, using the distal internal carotid diameter as the denominator. CONTRAST:  21mL OMNIPAQUE IOHEXOL 350 MG/ML SOLN COMPARISON:  None. FINDINGS: CT HEAD Brain: There is no acute intracranial hemorrhage, mass effect, or edema. No acute appearing loss of gray-white differentiation. Chronic infarct present in the left parietotemporal lobes. There is no extra-axial fluid collection. Ventricles and sulci are within normal limits in size and configuration. Vascular: No hyperdense vessel. Skull: Calvarium is unremarkable. Sinuses/Orbits: No acute finding. Other: None. ASPECTS (Alberta Stroke Program Early CT Score) - Ganglionic level infarction (caudate, lentiform nuclei, internal capsule, insula, M1-M3 cortex): 7 - Supraganglionic infarction (M4-M6 cortex): 3 Total score (0-10 with 10 being normal): 10 Review of the MIP images confirms the above findings CTA NECK  Aortic arch: Great vessel origins are patent. Right carotid system: Patent. Mixed plaque at the ICA origin causing less than 50% stenosis. Left carotid system: Patent. Minimal noncalcified plaque at the ICA origin without stenosis. Vertebral arteries: Patent and codominant. No stenosis or evidence of dissection. Skeleton: Degenerative changes of the cervical spine. Other neck: Unremarkable. Upper chest: Included upper lungs are clear. Review of the MIP images confirms the above findings CTA HEAD Anterior circulation: Intracranial internal carotid arteries are patent with minor calcified plaque. Middle cerebral arteries are patent. There is high-grade stenosis of a proximal left M2 MCA branch. Posterior circulation: Intracranial vertebral arteries are patent. Basilar artery is patent. Major cerebellar artery origins are patent. Posterior cerebral arteries are patent. Venous sinuses: Patent as allowed by contrast bolus timing. Review of the MIP images confirms the above findings IMPRESSION: No acute intracranial hemorrhage or evidence of acute infarction. Chronic left parietotemporal infarct. No large vessel occlusion. Plaque at the ICA origins without hemodynamically significant stenosis. 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 These results were called by telephone at the time of interpretation on 11/03/2020 at 12:26 pm to provider Dr. Amada Jupiter, who verbally acknowledged these results. Electronically Signed   By: Guadlupe Spanish M.D.   On: 11/03/2020 12:32   CT CEREBRAL PERFUSION W CONTRAST  Result Date: 11/03/2020 CLINICAL DATA:  Code stroke follow-up EXAM: CT PERFUSION BRAIN TECHNIQUE: Multiphase CT imaging of the brain was performed following IV bolus contrast injection. Subsequent parametric perfusion maps were calculated using RAPID software. CONTRAST:  40mL OMNIPAQUE IOHEXOL 350 MG/ML SOLN COMPARISON:  None. FINDINGS: CBF (<30%) Volume: 0mL Perfusion (Tmax>6.0s) volume: 0mL Mismatch Volume:  0mL Infarct Core: 0 mL Infarction Location: None. IMPRESSION: No evidence of core infarction or penumbra. Electronically Signed   By: Guadlupe Spanish M.D.   On: 11/03/2020 12:42   CT HEAD CODE STROKE WO CONTRAST  Result Date: 11/03/2020 CLINICAL DATA:  Code stroke.  Altered mental status and aphasia EXAM: CT HEAD WITHOUT CONTRAST CT ANGIOGRAPHY OF THE HEAD AND NECK TECHNIQUE: Contiguous axial images were obtained from the base of the skull through the vertex without intravenous contrast. Multidetector CT imaging of the head and neck was performed using the standard protocol during bolus administration of intravenous contrast. Multiplanar CT image reconstructions and MIPs were obtained to evaluate the vascular anatomy. Carotid stenosis measurements (when applicable) are obtained utilizing NASCET criteria, using the distal internal carotid diameter as the denominator. CONTRAST:  75mL OMNIPAQUE IOHEXOL 350 MG/ML SOLN COMPARISON:  None. FINDINGS: CT HEAD Brain: There is no acute intracranial hemorrhage, mass effect, or edema. No acute appearing loss of gray-white differentiation. Chronic infarct present in the left parietotemporal lobes. There is no extra-axial fluid collection. Ventricles and sulci are within normal limits in size and configuration. Vascular: No hyperdense vessel. Skull: Calvarium is unremarkable. Sinuses/Orbits: No acute finding. Other: None. ASPECTS (Alberta Stroke Program Early CT Score) - Ganglionic level infarction (caudate, lentiform nuclei, internal capsule, insula, M1-M3 cortex): 7 - Supraganglionic infarction (M4-M6 cortex): 3 Total score (0-10 with 10 being normal): 10 Review of the MIP images confirms the above findings CTA NECK Aortic arch: Great vessel origins are patent. Right carotid system: Patent. Mixed plaque at the ICA origin causing less than 50% stenosis. Left carotid system: Patent. Minimal noncalcified plaque at the ICA origin without stenosis. Vertebral arteries: Patent and  codominant. No stenosis or evidence of dissection. Skeleton: Degenerative changes of the cervical spine. Other neck: Unremarkable. Upper chest: Included upper lungs are clear. Review of the MIP images confirms the above findings CTA HEAD Anterior circulation: Intracranial internal carotid arteries are patent with minor calcified plaque. Middle cerebral arteries are patent. There is high-grade stenosis of a proximal left M2 MCA branch. Posterior circulation: Intracranial vertebral arteries are patent. Basilar artery is patent. Major cerebellar artery origins are patent. Posterior cerebral arteries are patent. Venous sinuses: Patent as allowed by contrast bolus timing. Review of the MIP images confirms the above findings IMPRESSION: No acute intracranial hemorrhage or evidence of acute infarction. Chronic left parietotemporal infarct. No large vessel occlusion. Plaque at the ICA origins without hemodynamically significant stenosis. 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 These results were called by telephone at the time of interpretation on 11/03/2020 at 12:26 pm to provider Dr. Amada Jupiter, who verbally acknowledged these results. Electronically Signed   By: Guadlupe Spanish M.D.   On: 11/03/2020 12:32      Subjective: She is back to baseline. She was not taking keppra as prescribe.  She  had one small episode of vaginal bleeding.   Discharge Exam: Vitals:   11/04/20 0842 11/04/20 1152  BP: 133/70 139/61  Pulse: 73 62  Resp: 16 16  Temp: 98.9 F (37.2 C) 98.3 F (36.8 C)  SpO2: 97% 96%     General: Pt is alert, awake, not in acute distress Cardiovascular: RRR, S1/S2 +, no rubs, no gallops Respiratory: CTA bilaterally, no wheezing, no rhonchi Abdominal: Soft, NT, ND, bowel sounds + Extremities: no edema, no cyanosis    The results of significant diagnostics from this hospitalization (including imaging, microbiology, ancillary and laboratory) are listed below for reference.      Microbiology: Recent Results (from the past 240 hour(s))  Resp Panel by RT-PCR (Flu A&B, Covid) Nasopharyngeal Swab     Status: None   Collection Time: 11/03/20 11:45 AM   Specimen: Nasopharyngeal Swab; Nasopharyngeal(NP) swabs in vial transport medium  Result Value Ref Range Status   SARS Coronavirus 2 by RT PCR NEGATIVE NEGATIVE Final    Comment: (NOTE) SARS-CoV-2 target nucleic acids are NOT DETECTED.  The SARS-CoV-2 RNA is generally detectable in upper respiratory specimens during the acute phase of infection. The lowest concentration of SARS-CoV-2 viral copies this assay can detect is 138 copies/mL. A negative result does not preclude SARS-Cov-2 infection and should not be used as the sole basis for treatment or other patient management decisions. A negative result may occur with  improper specimen collection/handling, submission of specimen other than nasopharyngeal swab, presence of viral mutation(s) within the areas targeted by this assay, and inadequate number of viral copies(<138 copies/mL). A negative result must be combined with clinical observations, patient history, and epidemiological information. The expected result is Negative.  Fact Sheet for Patients:  BloggerCourse.comhttps://www.fda.gov/media/152166/download  Fact Sheet for Healthcare Providers:  SeriousBroker.ithttps://www.fda.gov/media/152162/download  This test is no t yet approved or cleared by the [place] FDA and  has been authorized for detection and/or diagnosis of SARS-CoV-2 by FDA under an Emergency Use Authorization (EUA). This EUA will remain  in effect (meaning this test can be used) for the duration of the COVID-19 declaration under Section 564(b)(1) of the Act, 21 U.S.C.section 360bbb-3(b)(1), unless the authorization is terminated  or revoked sooner.       Influenza A by PCR NEGATIVE NEGATIVE Final   Influenza B by PCR NEGATIVE NEGATIVE Final    Comment: (NOTE) The Xpert Xpress SARS-CoV-2/FLU/RSV plus assay is  intended as an aid in the diagnosis of influenza from Nasopharyngeal swab specimens and should not be used as a sole basis for treatment. Nasal washings and aspirates are unacceptable for Xpert Xpress SARS-CoV-2/FLU/RSV testing.  Fact Sheet for Patients: BloggerCourse.comhttps://www.fda.gov/media/152166/download  Fact Sheet for Healthcare Providers: SeriousBroker.ithttps://www.fda.gov/media/152162/download  This test is not yet approved or cleared by the [place] FDA and has been authorized for detection and/or diagnosis of SARS-CoV-2 by FDA under an Emergency Use Authorization (EUA). This EUA will remain in effect (meaning this test can be used) for the duration of the COVID-19 declaration under Section 564(b)(1) of the Act, 21 U.S.C. section 360bbb-3(b)(1), unless the authorization is terminated or revoked.  Performed at [facility], [address] [place], [place] [id number]      Labs: BNP (last 3 results) No results for input(s): BNP in the last 8760 hours. Basic Metabolic Panel: Recent Labs  Lab 11/03/20 1104 11/03/20 1608 11/04/20 0328  NA 138 140 140  K 3.9 3.8 3.5  CL 101 103 106  CO2 27 27 27   GLUCOSE 96 104* 97  BUN 12 12 11   CREATININE 0.85 0.67 0.76  CALCIUM 9.0 9.1 8.5*   Liver Function Tests: Recent Labs  Lab 11/03/20 1104 11/03/20 1608  AST 18 22  ALT 18 19  ALKPHOS 108 106  BILITOT 0.6 0.7  PROT 8.2* 8.0  ALBUMIN 4.2 4.1   No results for input(s): LIPASE, AMYLASE in the last 168 hours. No results for input(s): AMMONIA in the last 168 hours. CBC: Recent Labs  Lab 11/03/20 1104 11/03/20 1608 11/04/20 0328  WBC 6.1  --  6.4  NEUTROABS  --  7.2  --   HGB 13.8  --  12.0  HCT 41.3  --  35.7*  MCV 84.6  --  84.6  PLT 304  --  291   Cardiac Enzymes: Recent Labs  Lab 11/04/20 0328  CKTOTAL 76   BNP: Invalid input(s): POCBNP CBG: No results for input(s): GLUCAP in the last 168 hours. D-Dimer No results for input(s): DDIMER in the last 72  hours. Hgb A1c No results for input(s): HGBA1C in the last 72 hours. Lipid Profile No results for input(s): CHOL, HDL, LDLCALC, TRIG, CHOLHDL, LDLDIRECT in the last 72 hours. Thyroid function studies No results for input(s): TSH, T4TOTAL, T3FREE, THYROIDAB in the last 72 hours.  Invalid input(s): FREET3 Anemia work up No results for input(s): VITAMINB12, FOLATE, FERRITIN, TIBC, IRON, RETICCTPCT in the last 72 hours. Urinalysis    Component Value Date/Time   COLORURINE STRAW (A) 11/03/2020 1254   APPEARANCEUR CLEAR (A) 11/03/2020 1254   LABSPEC 1.029 11/03/2020 1254   PHURINE 5.0 11/03/2020 1254   GLUCOSEU NEGATIVE 11/03/2020 1254   HGBUR MODERATE (A) 11/03/2020 1254   BILIRUBINUR NEGATIVE 11/03/2020 1254   KETONESUR NEGATIVE 11/03/2020 1254   PROTEINUR NEGATIVE 11/03/2020 1254   NITRITE NEGATIVE 11/03/2020 1254   LEUKOCYTESUR NEGATIVE 11/03/2020 1254   Sepsis Labs Invalid input(s): PROCALCITONIN,  WBC,  LACTICIDVEN Microbiology Recent Results (from the past 240 hour(s))  Resp Panel by RT-PCR (Flu A&B, Covid) Nasopharyngeal Swab     Status: None   Collection Time: 11/03/20 11:45 AM   Specimen: Nasopharyngeal Swab; Nasopharyngeal(NP) swabs in vial transport medium  Result Value Ref Range Status   SARS Coronavirus 2 by RT PCR NEGATIVE NEGATIVE Final    Comment: (NOTE) SARS-CoV-2 target nucleic acids are NOT DETECTED.  The SARS-CoV-2 RNA is generally detectable in upper respiratory specimens during the acute phase of infection. The lowest concentration of SARS-CoV-2 viral copies this assay can detect is 138 copies/mL. A negative result does not preclude SARS-Cov-2 infection and should not be used as the sole basis for treatment or other patient management decisions. A negative result may occur with  improper specimen collection/handling, submission of specimen other than nasopharyngeal swab, presence of viral mutation(s) within the areas targeted by this assay, and  inadequate number of viral copies(<138 copies/mL). A negative result must be combined with clinical observations, patient history, and epidemiological information. The expected result is Negative.  Fact Sheet for Patients:  11/05/20  Fact Sheet for Healthcare Providers:  BloggerCourse.com  This test is no t yet approved or cleared by the [place] FDA and  has been authorized for detection and/or diagnosis of SARS-CoV-2 by FDA under an Emergency Use Authorization (EUA). This EUA will remain  in effect (meaning this test can be used) for the duration of the COVID-19 declaration under Section 564(b)(1) of the Act, 21 U.S.C.section 360bbb-3(b)(1), unless the authorization is terminated  or revoked sooner.       Influenza  A by PCR NEGATIVE NEGATIVE Final   Influenza B by PCR NEGATIVE NEGATIVE Final    Comment: (NOTE) The Xpert Xpress SARS-CoV-2/FLU/RSV plus assay is intended as an aid in the diagnosis of influenza from Nasopharyngeal swab specimens and should not be used as a sole basis for treatment. Nasal washings and aspirates are unacceptable for Xpert Xpress SARS-CoV-2/FLU/RSV testing.  Fact Sheet for Patients: BloggerCourse.com  Fact Sheet for Healthcare Providers: SeriousBroker.it  This test is not yet approved or cleared by the [place] FDA and has been authorized for detection and/or diagnosis of SARS-CoV-2 by FDA under an Emergency Use Authorization (EUA). This EUA will remain in effect (meaning this test can be used) for the duration of the COVID-19 declaration under Section 564(b)(1) of the Act, 21 U.S.C. section 360bbb-3(b)(1), unless the authorization is terminated or revoked.  Performed at [facility], [address], [place], [place] [address]      Time coordinating discharge: 40 minutes  SIGNED:   Alba Cory,  MD  [facility]

## 2020-11-05 ENCOUNTER — Encounter: Payer: Self-pay | Admitting: Emergency Medicine

## 2020-11-18 NOTE — Progress Notes (Signed)
Carelink Summary Report / Loop Recorder 

## 2020-12-02 ENCOUNTER — Ambulatory Visit (INDEPENDENT_AMBULATORY_CARE_PROVIDER_SITE_OTHER): Payer: Self-pay

## 2020-12-02 DIAGNOSIS — I639 Cerebral infarction, unspecified: Secondary | ICD-10-CM

## 2020-12-03 LAB — CUP PACEART REMOTE DEVICE CHECK
Date Time Interrogation Session: 20220425013110
Implantable Pulse Generator Implant Date: 20210315

## 2020-12-11 ENCOUNTER — Other Ambulatory Visit: Payer: Self-pay

## 2020-12-19 NOTE — Progress Notes (Signed)
Carelink Summary Report / Loop Recorder 

## 2020-12-24 NOTE — Addendum Note (Signed)
Addended by: Geralyn Flash D on: 12/24/2020 03:34 PM   Modules accepted: Level of Service

## 2021-01-06 LAB — CUP PACEART REMOTE DEVICE CHECK
Date Time Interrogation Session: 20220528013504
Implantable Pulse Generator Implant Date: 20210315

## 2021-01-07 ENCOUNTER — Ambulatory Visit (INDEPENDENT_AMBULATORY_CARE_PROVIDER_SITE_OTHER): Payer: Self-pay

## 2021-01-07 DIAGNOSIS — I639 Cerebral infarction, unspecified: Secondary | ICD-10-CM

## 2021-01-29 NOTE — Progress Notes (Signed)
Carelink Summary Report / Loop Recorder 

## 2021-01-30 ENCOUNTER — Telehealth: Payer: Self-pay | Admitting: Pharmacy Technician

## 2021-01-30 NOTE — Telephone Encounter (Signed)
Patient failed to provide 2022 proof of income.  No additional medication assistance will be provided by [facility] without the required proof of income documentation.  Patient notified by letter.  Sherilyn Dacosta Care Manager [facility]    [address] [place], [place]  [address]  January 30, 2021    [address] [place], [place]  [address]  Dear Tresa Endo:  This is to inform you that you are no longer eligible to receive medication assistance at [facility].  The reason(s) are:    _____Your total gross monthly household income exceeds 250% of the Federal Poverty Level.   _____Tangible assets (savings, checking, stocks/bonds, pension, retirement, etc.) exceeds our limit  _____You are eligible to receive benefits from [facility], [facility] or HIV Medication              Assistance Program _____You are eligible to receive benefits from a Medicare Part "D" plan _____You have prescription insurance  _____You are not an [facility] resident __X__Failure to provide all requested documentation (proof of income for 2022, and/or Patient Intake Application, DOH Attestation, Contract, etc).    Medication assistance will resume once all requested documentation has been returned to our clinic.  If you have questions, please contact our clinic at [phone].    Thank you,  [facility]

## 2021-02-05 ENCOUNTER — Other Ambulatory Visit: Payer: Self-pay

## 2021-02-11 ENCOUNTER — Ambulatory Visit (INDEPENDENT_AMBULATORY_CARE_PROVIDER_SITE_OTHER): Payer: Self-pay

## 2021-02-11 DIAGNOSIS — I639 Cerebral infarction, unspecified: Secondary | ICD-10-CM

## 2021-02-12 LAB — CUP PACEART REMOTE DEVICE CHECK
Date Time Interrogation Session: 20220630013920
Implantable Pulse Generator Implant Date: 20210315

## 2021-02-28 NOTE — Progress Notes (Signed)
Carelink Summary Report / Loop Recorder 

## 2021-03-13 LAB — CUP PACEART REMOTE DEVICE CHECK
Date Time Interrogation Session: 20220802015207
Implantable Pulse Generator Implant Date: 20210315

## 2021-03-17 ENCOUNTER — Ambulatory Visit (INDEPENDENT_AMBULATORY_CARE_PROVIDER_SITE_OTHER): Payer: Self-pay

## 2021-03-17 DIAGNOSIS — I639 Cerebral infarction, unspecified: Secondary | ICD-10-CM

## 2021-04-10 NOTE — Progress Notes (Signed)
Carelink Summary Report / Loop Recorder 

## 2021-04-21 ENCOUNTER — Ambulatory Visit (INDEPENDENT_AMBULATORY_CARE_PROVIDER_SITE_OTHER): Payer: Self-pay

## 2021-04-21 DIAGNOSIS — I639 Cerebral infarction, unspecified: Secondary | ICD-10-CM

## 2021-04-23 LAB — CUP PACEART REMOTE DEVICE CHECK
Date Time Interrogation Session: 20220904015047
Implantable Pulse Generator Implant Date: 20210315

## 2021-04-29 NOTE — Progress Notes (Signed)
Carelink Summary Report / Loop Recorder 

## 2021-05-26 ENCOUNTER — Ambulatory Visit (INDEPENDENT_AMBULATORY_CARE_PROVIDER_SITE_OTHER): Payer: Self-pay

## 2021-05-26 DIAGNOSIS — I639 Cerebral infarction, unspecified: Secondary | ICD-10-CM

## 2021-05-28 LAB — CUP PACEART REMOTE DEVICE CHECK
Date Time Interrogation Session: 20221007022740
Implantable Pulse Generator Implant Date: 20210315

## 2021-06-04 NOTE — Progress Notes (Signed)
Carelink Summary Report / Loop Recorder 

## 2021-07-21 ENCOUNTER — Ambulatory Visit (INDEPENDENT_AMBULATORY_CARE_PROVIDER_SITE_OTHER): Payer: Self-pay

## 2021-07-21 DIAGNOSIS — I639 Cerebral infarction, unspecified: Secondary | ICD-10-CM

## 2021-07-22 LAB — CUP PACEART REMOTE DEVICE CHECK
Date Time Interrogation Session: 20221212013246
Implantable Pulse Generator Implant Date: 20210315

## 2021-07-30 NOTE — Progress Notes (Signed)
Carelink Summary Report / Loop Recorder 

## 2021-08-25 ENCOUNTER — Ambulatory Visit (INDEPENDENT_AMBULATORY_CARE_PROVIDER_SITE_OTHER): Payer: Medicaid Other

## 2021-08-25 DIAGNOSIS — I639 Cerebral infarction, unspecified: Secondary | ICD-10-CM

## 2021-08-26 LAB — CUP PACEART REMOTE DEVICE CHECK
Date Time Interrogation Session: 20230115233028
Implantable Pulse Generator Implant Date: 20210315

## 2021-09-03 NOTE — Progress Notes (Signed)
Carelink Summary Report / Loop Recorder 

## 2021-09-29 ENCOUNTER — Ambulatory Visit (INDEPENDENT_AMBULATORY_CARE_PROVIDER_SITE_OTHER): Payer: Medicaid Other

## 2021-09-29 DIAGNOSIS — I639 Cerebral infarction, unspecified: Secondary | ICD-10-CM | POA: Diagnosis not present

## 2021-09-30 LAB — CUP PACEART REMOTE DEVICE CHECK
Date Time Interrogation Session: 20230217233023
Implantable Pulse Generator Implant Date: 20210315

## 2021-10-03 NOTE — Progress Notes (Signed)
Carelink Summary Report / Loop Recorder 

## 2021-11-03 ENCOUNTER — Ambulatory Visit (INDEPENDENT_AMBULATORY_CARE_PROVIDER_SITE_OTHER): Payer: Medicaid Other

## 2021-11-03 DIAGNOSIS — I639 Cerebral infarction, unspecified: Secondary | ICD-10-CM

## 2021-11-05 LAB — CUP PACEART REMOTE DEVICE CHECK
Date Time Interrogation Session: 20230326230452
Implantable Pulse Generator Implant Date: 20210315

## 2021-11-12 NOTE — Progress Notes (Signed)
Carelink Summary Report / Loop Recorder 

## 2021-12-08 ENCOUNTER — Ambulatory Visit (INDEPENDENT_AMBULATORY_CARE_PROVIDER_SITE_OTHER): Payer: Medicare Other

## 2021-12-08 DIAGNOSIS — I639 Cerebral infarction, unspecified: Secondary | ICD-10-CM

## 2021-12-08 LAB — CUP PACEART REMOTE DEVICE CHECK
Date Time Interrogation Session: 20230428230826
Implantable Pulse Generator Implant Date: 20210315

## 2021-12-22 NOTE — Progress Notes (Signed)
Carelink Summary Report / Loop Recorder 

## 2022-01-11 ENCOUNTER — Encounter: Payer: Self-pay | Admitting: Internal Medicine

## 2022-01-11 ENCOUNTER — Observation Stay: Payer: Medicare Other

## 2022-01-11 ENCOUNTER — Observation Stay
Admission: EM | Admit: 2022-01-11 | Discharge: 2022-01-12 | Disposition: A | Discharge status: Home / Self Care | Payer: Medicare Other | Attending: Internal Medicine | Admitting: Internal Medicine

## 2022-01-11 ENCOUNTER — Other Ambulatory Visit: Payer: Self-pay

## 2022-01-11 ENCOUNTER — Emergency Department: Payer: Medicare Other

## 2022-01-11 DIAGNOSIS — Z8673 Personal history of transient ischemic attack (TIA), and cerebral infarction without residual deficits: Secondary | ICD-10-CM | POA: Diagnosis not present

## 2022-01-11 DIAGNOSIS — G459 Transient cerebral ischemic attack, unspecified: Secondary | ICD-10-CM | POA: Diagnosis not present

## 2022-01-11 DIAGNOSIS — K589 Irritable bowel syndrome without diarrhea: Secondary | ICD-10-CM | POA: Diagnosis not present

## 2022-01-11 DIAGNOSIS — R202 Paresthesia of skin: Secondary | ICD-10-CM | POA: Insufficient documentation

## 2022-01-11 DIAGNOSIS — R569 Unspecified convulsions: Secondary | ICD-10-CM | POA: Insufficient documentation

## 2022-01-11 DIAGNOSIS — R918 Other nonspecific abnormal finding of lung field: Secondary | ICD-10-CM | POA: Insufficient documentation

## 2022-01-11 DIAGNOSIS — E785 Hyperlipidemia, unspecified: Secondary | ICD-10-CM | POA: Insufficient documentation

## 2022-01-11 DIAGNOSIS — G9389 Other specified disorders of brain: Secondary | ICD-10-CM | POA: Diagnosis not present

## 2022-01-11 DIAGNOSIS — Z720 Tobacco use: Secondary | ICD-10-CM | POA: Diagnosis present

## 2022-01-11 DIAGNOSIS — I10 Essential (primary) hypertension: Secondary | ICD-10-CM | POA: Insufficient documentation

## 2022-01-11 DIAGNOSIS — K5792 Diverticulitis of intestine, part unspecified, without perforation or abscess without bleeding: Secondary | ICD-10-CM | POA: Diagnosis not present

## 2022-01-11 DIAGNOSIS — F17210 Nicotine dependence, cigarettes, uncomplicated: Secondary | ICD-10-CM | POA: Insufficient documentation

## 2022-01-11 DIAGNOSIS — Z7982 Long term (current) use of aspirin: Secondary | ICD-10-CM | POA: Diagnosis not present

## 2022-01-11 DIAGNOSIS — R200 Anesthesia of skin: Secondary | ICD-10-CM | POA: Diagnosis not present

## 2022-01-11 DIAGNOSIS — R4701 Aphasia: Secondary | ICD-10-CM | POA: Diagnosis not present

## 2022-01-11 DIAGNOSIS — Z79899 Other long term (current) drug therapy: Secondary | ICD-10-CM | POA: Diagnosis not present

## 2022-01-11 DIAGNOSIS — R7989 Other specified abnormal findings of blood chemistry: Secondary | ICD-10-CM | POA: Insufficient documentation

## 2022-01-11 DIAGNOSIS — R531 Weakness: Principal | ICD-10-CM | POA: Insufficient documentation

## 2022-01-11 DIAGNOSIS — Z8249 Family history of ischemic heart disease and other diseases of the circulatory system: Secondary | ICD-10-CM | POA: Insufficient documentation

## 2022-01-11 DIAGNOSIS — R29898 Other symptoms and signs involving the musculoskeletal system: Secondary | ICD-10-CM | POA: Insufficient documentation

## 2022-01-11 DIAGNOSIS — Z66 Do not resuscitate: Secondary | ICD-10-CM | POA: Diagnosis not present

## 2022-01-11 DIAGNOSIS — G40909 Epilepsy, unspecified, not intractable, without status epilepticus: Secondary | ICD-10-CM | POA: Insufficient documentation

## 2022-01-11 HISTORY — DX: Bipolar disorder, unspecified: F31.9

## 2022-01-11 HISTORY — DX: Depression, unspecified: F32.A

## 2022-01-11 HISTORY — DX: Anxiety disorder, unspecified: F41.9

## 2022-01-11 HISTORY — DX: Unspecified psychosis not due to a substance or known physiological condition: F29

## 2022-01-11 LAB — COMPREHENSIVE METABOLIC PANEL
ALT: 15 U/L (ref 0–44)
AST: 18 U/L (ref 15–41)
Albumin: 3.9 g/dL (ref 3.5–5.0)
Alkaline Phosphatase: 111 U/L (ref 38–126)
Anion gap: 4 — ABNORMAL LOW (ref 5–15)
BUN: 9 mg/dL (ref 8–23)
CO2: 31 mmol/L (ref 22–32)
Calcium: 8.7 mg/dL — ABNORMAL LOW (ref 8.9–10.3)
Chloride: 104 mmol/L (ref 98–111)
Creatinine, Ser: 0.58 mg/dL (ref 0.44–1.00)
GFR, Estimated: >60 mL/min (ref >60)
Glucose, Bld: 92 mg/dL (ref 70–99)
Potassium: 3.8 mmol/L (ref 3.5–5.1)
Sodium: 139 mmol/L (ref 135–145)
Total Bilirubin: 0.3 mg/dL (ref 0.3–1.2)
Total Protein: 7.4 g/dL (ref 6.5–8.1)

## 2022-01-11 LAB — URINE DRUG SCREEN, QUALITATIVE (ARMC ONLY)
Amphetamines, Ur Screen: NOT DETECTED
Barbiturates, Ur Screen: NOT DETECTED
Benzodiazepine, Ur Scrn: NOT DETECTED
Cannabinoid 50 Ng, Ur ~~LOC~~: POSITIVE — AB
Cocaine Metabolite,Ur ~~LOC~~: NOT DETECTED
MDMA (Ecstasy)Ur Screen: NOT DETECTED
Methadone Scn, Ur: NOT DETECTED
Opiate, Ur Screen: NOT DETECTED
Phencyclidine (PCP) Ur S: NOT DETECTED
Tricyclic, Ur Screen: NOT DETECTED

## 2022-01-11 LAB — CBC WITH DIFFERENTIAL/PLATELET
Abs Immature Granulocytes: 0.02 10*3/uL (ref 0.00–0.07)
Basophils Absolute: 0 10*3/uL (ref 0.0–0.1)
Basophils Relative: 1 %
Eosinophils Absolute: 0.2 10*3/uL (ref 0.0–0.5)
Eosinophils Relative: 3 %
HCT: 40.1 % (ref 36.0–46.0)
Hemoglobin: 13.1 g/dL (ref 12.0–15.0)
Immature Granulocytes: 0 %
Lymphocytes Relative: 27 %
Lymphs Abs: 1.9 10*3/uL (ref 0.7–4.0)
MCH: 27.8 pg (ref 26.0–34.0)
MCHC: 32.7 g/dL (ref 30.0–36.0)
MCV: 85 fL (ref 80.0–100.0)
Monocytes Absolute: 0.5 10*3/uL (ref 0.1–1.0)
Monocytes Relative: 7 %
Neutro Abs: 4.3 10*3/uL (ref 1.7–7.7)
Neutrophils Relative %: 62 %
Platelets: 259 10*3/uL (ref 150–400)
RBC: 4.72 MIL/uL (ref 3.87–5.11)
RDW: 13.1 % (ref 11.5–15.5)
WBC: 6.9 10*3/uL (ref 4.0–10.5)
nRBC: 0 % (ref 0.0–0.2)

## 2022-01-11 LAB — PROTIME-INR
INR: 0.9 (ref 0.8–1.2)
Prothrombin Time: 12.4 seconds (ref 11.4–15.2)

## 2022-01-11 LAB — CBG MONITORING, ED: Glucose-Capillary: 114 mg/dL — ABNORMAL HIGH (ref 70–99)

## 2022-01-11 LAB — APTT: aPTT: 33 seconds (ref 24–36)

## 2022-01-11 MED ORDER — LEVETIRACETAM 500 MG PO TABS
1000.0000 mg | ORAL_TABLET | Freq: Two times a day (BID) | ORAL | Status: DC
  Administered 2022-01-11 – 2022-01-12 (×2): 1000 mg via ORAL
  Filled 2022-01-11 (×2): qty 2

## 2022-01-11 MED ORDER — HYDRALAZINE HCL 20 MG/ML IJ SOLN: 5.0000 mg | Freq: Four times a day (QID) | INTRAMUSCULAR | Status: DC | PRN

## 2022-01-11 MED ORDER — LORAZEPAM 2 MG/ML IJ SOLN: 2.0000 mg | INTRAMUSCULAR | Status: DC | PRN

## 2022-01-11 MED ORDER — ONDANSETRON HCL 4 MG/2ML IJ SOLN: 4.0000 mg | Freq: Four times a day (QID) | INTRAMUSCULAR | Status: DC | PRN

## 2022-01-11 MED ORDER — ACETAMINOPHEN 650 MG RE SUPP: 650.0000 mg | RECTAL | Status: DC | PRN

## 2022-01-11 MED ORDER — ENOXAPARIN SODIUM 40 MG/0.4ML IJ SOSY: 40.0000 mg | PREFILLED_SYRINGE | Freq: Every day | INTRAMUSCULAR | Status: DC

## 2022-01-11 MED ORDER — LEVETIRACETAM IN NACL 1000 MG/100ML IV SOLN
1000.0000 mg | Freq: Once | INTRAVENOUS | Status: AC
Start: 2022-01-11 — End: 2022-01-11
  Administered 2022-01-11: 1000 mg via INTRAVENOUS
  Filled 2022-01-11: qty 100

## 2022-01-11 MED ORDER — ASPIRIN 81 MG PO CHEW
81.0000 mg | CHEWABLE_TABLET | Freq: Every day | ORAL | Status: DC
  Administered 2022-01-12: 81 mg via ORAL
  Filled 2022-01-11: qty 1

## 2022-01-11 MED ORDER — ONDANSETRON HCL 4 MG PO TABS: 4.0000 mg | ORAL_TABLET | Freq: Four times a day (QID) | ORAL | Status: DC | PRN

## 2022-01-11 MED ORDER — SENNOSIDES-DOCUSATE SODIUM 8.6-50 MG PO TABS: 1.0000 | ORAL_TABLET | Freq: Two times a day (BID) | ORAL | Status: DC | PRN

## 2022-01-11 MED ORDER — ACETAMINOPHEN 325 MG PO TABS: 650.0000 mg | ORAL_TABLET | ORAL | Status: DC | PRN

## 2022-01-11 MED ORDER — NICOTINE 21 MG/24HR TD PT24: 21.0000 mg | MEDICATED_PATCH | Freq: Every day | TRANSDERMAL | Status: DC | PRN

## 2022-01-11 MED ORDER — ACETAMINOPHEN 160 MG/5ML PO SOLN: 650.0000 mg | ORAL | Status: DC | PRN

## 2022-01-11 MED ORDER — STROKE: EARLY STAGES OF RECOVERY BOOK
Freq: Once | Status: AC
Start: 2022-01-11 — End: 2022-01-11

## 2022-01-11 MED ORDER — SODIUM CHLORIDE 0.9% FLUSH
3.0000 mL | Freq: Once | INTRAVENOUS | Status: AC
  Administered 2022-01-11: 3 mL via INTRAVENOUS

## 2022-01-11 NOTE — H&P (Addendum)
History and Physical   [name]:[id number] DOB: 1955/12/10 DOA: 01/11/2022  PCP: Patient, No Pcp Per (Inactive)  Outpatient Specialists: Dr. Gary Fleet, [facility] Neurology Patient coming from: Home via EMS  I have personally briefly reviewed patient's old medical records in [facility] EMR.  Chief Concern: Right-sided numbness and weakness with word finding  HPI: Ms. 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  Initial vitals in the emergency department showed temperature 98.4, respiration rate of 17, heart rate of 70, blood pressure 154/82, SPO2 100% on room air.  Serum sodium is 139, potassium 3.8, chloride of 104, bicarb of 31, BUN of 9, serum creatinine of 0.58, GFR greater than 60, nonfasting blood glucose 92, WBC 6.9, hemoglobin 13.1, platelets of 259.  Code stroke was initiated  Imaging with CT of the head without contrast was read as no acute intracranial process.  Neurology service has been consulted and recommended hospital admission for EEG and MRI.  ED treatment: Keppra 1000 mg IV one-time loading dose.  At bedside patient is able to tell me her name, her age, the current calendar year and her current location.  She had slowed, word finding difficulty on examination.  Her baseline is unknown to me.  No friends or family was at bedside.  She reports this morning, when she was sitting down when she had sudden onset of right-sided numbness of her body.  She denies any numbness of her face.  She reports the numbness started on her right shoulder and gradually went down her arm and down her legs.  She immediately called her friend, Harriett Sine, who stated that she needed to call 911 for help.  She denies  fever, chest pain, shortness of breath, abdominal pain, dysuria, hematuria, diarrhea, blood in her stool, nausea, vomiting, dysphagia.  She reports her last bowel movement was a.m. prior to presentation and the bowel movement was normal.  Social history: She lives at home by herself with 2 cats.  She endorses daily tobacco use.  She started smoking cigarettes at age 48.  At her peak she was smoking 2 packs/day and currently is down to half a pack per day.  She is not ready to quit any further.  She endorses infrequent EtOH use, her last drink was 3 to 4 months ago.  She denies IV drug use.  She endorses nightly THC use to help her sleep.  She is retired and formerly worked in [employer].  ROS: Constitutional: no weight change, no fever ENT/Mouth: no sore throat, no rhinorrhea Eyes: no eye pain, no vision changes Cardiovascular: no chest pain, no dyspnea,  no edema, no palpitations Respiratory: no cough, no sputum, no wheezing Gastrointestinal: no nausea, no vomiting, no diarrhea, no constipation Genitourinary: no urinary incontinence, no dysuria, no hematuria Musculoskeletal: no arthralgias, no myalgias Skin: no skin lesions, no pruritus, Neuro: + weakness and numbness of the right upper and lower extremities. no loss of consciousness, no syncope Psych: no anxiety, no depression, no decrease appetite Heme/Lymph: no bruising, no bleeding  ED Course: Discussed with emergency medicine provider, patient requiring hospitalization for chief concerns of strokelike symptoms.  Assessment/Plan  Principal Problem:   Right sided weakness Active Problems:   Seizure (HCC)   HTN (hypertension)   HLD (hyperlipidemia)   Tobacco abuse  Assessment and Plan:  * Right sided weakness Stroke-like symptoms: Word finding difficulty, acute onset of right-sided weakness and numbness - Etiology work-up in progress - CT head without contrast read as: No acute intracranial process - Neurology  service, Dr. Otelia Limes, has been consulted and recommends admission to hospitalist service, EEG, MRI of the brain without contrast - Complete echo ordered - Fasting lipid and A1c ordered - Permissive hypertension per neurology recommendations - Frequent neuro vascular checks - N.p.o. pending swallow study - PT, OT, SLP - Admit to telemetry medical, observation  Tobacco abuse - Patient currently smokes half a pack per day and she is not ready to quit - Nicotine patch as needed ordered  HTN (hypertension) - Home amlodipine 5 mg daily has been held for permissive hypertension - Hydralazine 5 mg IV every 6 hours as needed for SBP greater than 180, 45 hours ordered  Seizure (HCC) - History of seizures and unreliably takes her p.m. Keppra - At bedside, I assisted with setting an alarm on her phone, for her p.m. Keppra dosing at 6 PM as patient states that she does not know how to use her phone in order to set up an alarm - On home Keppra 1000 mg p.o. twice daily, patient endorses that she frequently forgets her evening dose - Status post Keppra IV 1 g loading dose in the ED - Continue home Keppra 1000 mg p.o. twice daily - Ativan 2 mg IV as needed for seizures, 4 doses ordered with instructions to administer as appropriate and then let provider know - EEG, routine has been ordered - Seizure precautions  CODE STATUS, confirmed DNR/DNI with patient and with RN, Pinkerton in the room  Chart reviewed.   DVT prophylaxis: Enoxaparin Code Status: DNR/DNI Diet: Heart healthy Family Communication: A phone call was offered, patient declines. Disposition Plan: Pending clinical course Consults called: Neurology Admission status: Telemetry medical, observation  Past Medical History:  Diagnosis Date   Anxiety    Bipolar disorder (HCC)    Depression    Diverticulitis    HLD (hyperlipidemia)    HTN (hypertension)    IBS (irritable bowel syndrome)    Psychosis (HCC)    Seasonal allergies     Stroke (HCC)    Tobacco abuse    Past Surgical History:  Procedure Laterality Date   CARPAL TUNNEL RELEASE     CESAREAN SECTION     CHOLECYSTECTOMY     TEE WITHOUT CARDIOVERSION N/A 10/06/2019   Procedure: TRANSESOPHAGEAL ECHOCARDIOGRAM (TEE);  Surgeon: Antonieta Iba, MD;  Location: [facility] ORS;  Service: Cardiovascular;  Laterality: N/A;   Social History:  reports that she has been smoking cigarettes. She has a 25.00 pack-year smoking history. She has never used smokeless tobacco. She reports current alcohol use. She reports current drug use. Drug: Marijuana.  Allergies  Allergen Reactions   Elemental Sulfur    Sulfa Antibiotics Hives   Sulfonylureas    Tetracyclines & Related Hives   Tetracyclines & Related    Family History  Problem Relation Age of Onset   Hypertension Mother    Hypertension Sister    Heart disease Mother    Family history: Family history reviewed and not pertinent.  Prior to Admission medications   Medication Sig Start Date End Date Taking? Authorizing Provider  amLODipine (NORVASC) 5 MG tablet Take 5 mg by mouth daily.   Yes [provider]  aspirin 81 MG chewable tablet Chew 81 mg by mouth daily.   Yes [provider]  levETIRAcetam (KEPPRA) 1000 MG tablet Take 1 tablet (1,000 mg total) by mouth 2 (two) times daily. 11/04/20  Yes Alba Coryegalado, Belkys A, MD   Physical Exam: Vitals:   01/11/22 1226 01/11/22 1231 01/11/22 1330 01/11/22 1613  BP:  (!) 154/82 139/68 (!) 164/72  Pulse:  70 72 72  Resp:  17 15 20   Temp: 98.4 F (36.9 C)   98.1 F (36.7 C)  TempSrc: Oral     SpO2:  100% 100% 99%  Weight:      Height:       Constitutional: appears age-appropriate, NAD, calm, comfortable Eyes: PERRL, lids and conjunctivae normal ENMT: Mucous membranes are moist. Posterior pharynx clear of any exudate or lesions. Age-appropriate dentition. Hearing appropriate Neck: normal, supple, no masses, no thyromegaly Respiratory: clear to  auscultation bilaterally, no wheezing, no crackles. Normal respiratory effort. No accessory muscle use.  Cardiovascular: Regular rate and rhythm, no murmurs / rubs / gallops.  Bilateral lower extremity trace extremity edema. 2+ pedal pulses. No carotid bruits.  Abdomen: Obese abdomen, no tenderness, no masses palpated, no hepatosplenomegaly. Bowel sounds positive.  Musculoskeletal: no clubbing / cyanosis. No joint deformity upper and lower extremities. Good ROM, no contractures, no atrophy. Normal muscle tone.  Skin: no rashes, lesions, ulcers. No induration Neurologic: Sensation intact. Strength 5/5 in all 4.  Psychiatric: Normal judgment and insight. Alert and oriented x 3. Normal mood.   EKG: independently reviewed, showing sinus rhythm with rate of 71, QTc 431  Chest x-ray on Admission: I personally reviewed and I agree with radiologist reading as below.  MR BRAIN WO CONTRAST  Result Date: 01/11/2022 CLINICAL DATA:  Right-sided numbness and weakness EXAM: MRI HEAD WITHOUT CONTRAST TECHNIQUE: Multiplanar, multiecho pulse sequences of the brain and surrounding structures were obtained without intravenous contrast. COMPARISON:  10/05/2019 MRI, correlation is also made with 01/11/2022 head CT FINDINGS: Brain: No restricted diffusion to suggest acute or subacute infarct. No acute hemorrhage, mass, mass effect, or midline shift. No hydrocephalus or extra-axial collection. Encephalomalacia in the left temporal, posterior frontal, and anterior parietal lobes from remote left MCA territory infarct. Scattered T2 hyperintense signal in the periventricular white matter, likely the sequela of chronic small vessel ischemic disease. Vascular: Normal flow voids. Skull and upper cervical spine: Normal marrow signal. Sinuses/Orbits: No acute finding. Other: The mastoids are well aerated. IMPRESSION: No acute intracranial process. Electronically Signed   By: Wiliam KeAlison  Vasan M.D.   On: 01/11/2022 16:16   DG Chest Port  1 View  Result Date: 01/11/2022 CLINICAL DATA:  [id number] EXAM: PORTABLE CHEST 1 VIEW COMPARISON:  None Available. FINDINGS: Evaluation is limited by patient rotation. The cardiomediastinal silhouette is normal in contour.Cardiac loop recorder. No pleural effusion. No pneumothorax. Questioned nodular opacity in the LEFT mid lung. Visualized abdomen is unremarkable. IMPRESSION: 1. Possible small nodular opacity in LEFT mid lung. This is nonspecific and could reflect summation artifact. Consider further evaluation with PA and lateral chest radiograph for improved evaluation when clinically appropriate. Electronically Signed   By: Meda KlinefelterStephanie  Peacock M.D.   On: 01/11/2022 15:12   CT HEAD CODE STROKE WO CONTRAST  Result Date: 01/11/2022 CLINICAL DATA:  Code stroke.  Right-sided numbness and weakness EXAM: CT HEAD WITHOUT CONTRAST TECHNIQUE: Contiguous axial images were obtained from the base of the skull through the vertex without intravenous contrast. RADIATION DOSE REDUCTION: This exam was performed according to the departmental dose-optimization program which includes automated exposure control, adjustment of the mA and/or kV according to patient size  and/or use of iterative reconstruction technique. COMPARISON:  10/28/2019 FINDINGS: Brain: No evidence of acute infarction, hemorrhage, cerebral edema, mass, mass effect, or midline shift. Ventricles and sulci are normal for age. No extra-axial fluid collection. Encephalomalacia in the left superior temporal, posterior frontal, and anterior temporal lobes from prior left MCA territory infarct. Vascular: No hyperdense vessel. Skull: Negative for fracture or focal lesion. Sinuses/Orbits: No acute finding. Other: The mastoid air cells are well aerated. ASPECTS [facility] Stroke Program Early CT Score) - Ganglionic level infarction (caudate, lentiform nuclei, internal capsule, insula, M1-M3 cortex): 7 - Supraganglionic infarction (M4-M6 cortex): 3 Total score (0-10 with 10  being normal): 10 IMPRESSION: 1. No acute intracranial process. 2. ASPECTS is 10 Code stroke imaging results were communicated on 01/11/2022 at 12:08 pm to provider Dr. Otelia Limes via telephone, who verbally acknowledged these results. Electronically Signed   By: Wiliam Ke M.D.   On: 01/11/2022 12:09    Labs on Admission: I have personally reviewed following labs  CBC: Recent Labs  Lab 01/11/22 1232  WBC 6.9  NEUTROABS 4.3  HGB 13.1  HCT 40.1  MCV 85.0  PLT 259   Basic Metabolic Panel: Recent Labs  Lab 01/11/22 1232  NA 139  K 3.8  CL 104  CO2 31  GLUCOSE 92  BUN 9  CREATININE 0.58  CALCIUM 8.7*   GFR: Estimated Creatinine Clearance: 65.4 mL/min (by C-G formula based on SCr of 0.58 mg/dL).  Liver Function Tests: Recent Labs  Lab 01/11/22 1232  AST 18  ALT 15  ALKPHOS 111  BILITOT 0.3  PROT 7.4  ALBUMIN 3.9   Coagulation Profile: Recent Labs  Lab 01/11/22 1232  INR 0.9   CBG: Recent Labs  Lab 01/11/22 1152  GLUCAP 114*   Urine analysis:    Component Value Date/Time   COLORURINE STRAW (A) 11/03/2020 1254   APPEARANCEUR CLEAR (A) 11/03/2020 1254   LABSPEC 1.029 11/03/2020 1254   PHURINE 5.0 11/03/2020 1254   GLUCOSEU NEGATIVE 11/03/2020 1254   HGBUR MODERATE (A) 11/03/2020 1254   BILIRUBINUR NEGATIVE 11/03/2020 1254   KETONESUR NEGATIVE 11/03/2020 1254   PROTEINUR NEGATIVE 11/03/2020 1254   NITRITE NEGATIVE 11/03/2020 1254   LEUKOCYTESUR NEGATIVE 11/03/2020 1254   Dr. Sedalia Muta [facility]  If 7PM-7AM, please contact overnight-coverage provider If 7AM-7PM, please contact day coverage provider [email or web address]  01/11/2022, 5:07 PM

## 2022-01-11 NOTE — ED Notes (Signed)
Pt taken to CT scan room 1 at this time.

## 2022-01-11 NOTE — ED Notes (Signed)
Pt being assessed at this time by Neurologist

## 2022-01-11 NOTE — Assessment & Plan Note (Addendum)
-   Home amlodipine 5 mg daily has been held for permissive hypertension. -- Resumed on amlodipine at d/c

## 2022-01-11 NOTE — Progress Notes (Signed)
Chaplain responded to Code Stroke.  Pt was not available.  Checked in with Diplomatic Services operational officer.  Please contact if support is needed.  Belia Heman, [place] Pager:  [phone]    01/11/22 1400  Clinical Encounter Type  Visited With Patient not available  Visit Type Initial  Referral From Physician  Consult/Referral To Chaplain  Stress Factors  Patient Stress Factors Health changes

## 2022-01-11 NOTE — Hospital Course (Signed)
Ms. 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  Initial vitals in the emergency department showed temperature 98.4, respiration rate of 17, heart rate of 70, blood pressure 154/82, SPO2 100% on room air.  Serum sodium is 139, potassium 3.8, chloride of 104, bicarb of 31, BUN of 9, serum creatinine of 0.58, GFR greater than 60, nonfasting blood glucose 92, WBC 6.9, hemoglobin 13.1, platelets of 259.  Code stroke was initiated  Imaging with CT of the head without contrast was read as no acute intracranial process.  Neurology service has been consulted and recommended hospital admission for EEG and MRI.  ED treatment: Keppra 1000 mg IV one-time loading dose.

## 2022-01-11 NOTE — ED Provider Notes (Addendum)
[facility] Provider Note    Event Date/Time   First MD Initiated Contact with Patient 01/11/22 1200     (approximate)   History   Code Stroke   HPI  Robin Mosley is a 66 y.o. female patient with a history of CVA affecting the right side of her body as well as history of seizure with intermittent compliance with her Keppra presents to the ER for evaluation of tingling and weakness of the right side.  Patient states that she frequently forgets her Keppra in the evenings.  Denies any nausea or vomiting no chest pain or shortness of breath.  She was made code stroke in route with EMS.  She is abided by neurology immediately upon arrival and taken to CT imaging.     Physical Exam   Triage Vital Signs: ED Triage Vitals  Enc Vitals Group     BP 01/11/22 1231 (!) 154/82     Pulse Rate 01/11/22 1231 70     Resp 01/11/22 1231 17     Temp 01/11/22 1226 98.4 F (36.9 C)     Temp Source 01/11/22 1226 Oral     SpO2 01/11/22 1231 100 %     Weight 01/11/22 1225 160 lb (72.6 kg)     Height 01/11/22 1225 5\' 2"  (1.575 m)     Head Circumference --      Peak Flow --      Pain Score 01/11/22 1227 0     Pain Loc --      Pain Edu? --      Excl. in GC? --     Most recent vital signs: Vitals:   01/11/22 1231 01/11/22 1330  BP: (!) 154/82 139/68  Pulse: 70 72  Resp: 17 15  Temp:    SpO2: 100% 100%     Constitutional: Alert  Eyes: Conjunctivae are normal.  Head: Atraumatic. Nose: No congestion/rhinnorhea. Mouth/Throat: Mucous membranes are moist.   Neck: Painless ROM.  Cardiovascular:   Good peripheral circulation. Respiratory: Normal respiratory effort.  No retractions.  Gastrointestinal: Soft and nontender.  Musculoskeletal:  no deformity Neurologic:  MAE spontaneously. No gross focal neurologic deficits are appreciated.  Skin:  Skin is warm, dry and intact. No rash noted. Psychiatric: Mood and affect are normal. Speech and behavior are  normal.    ED Results / Procedures / Treatments   Labs (all labs ordered are listed, but only abnormal results are displayed) Labs Reviewed  COMPREHENSIVE METABOLIC PANEL - Abnormal; Notable for the following components:      Result Value   Calcium 8.7 (*)    Anion gap 4 (*)    All other components within normal limits  CBG MONITORING, ED - Abnormal; Notable for the following components:   Glucose-Capillary 114 (*)    All other components within normal limits  CBC WITH DIFFERENTIAL/PLATELET  PROTIME-INR  APTT  PROTIME-INR  APTT  CBC  DIFFERENTIAL  COMPREHENSIVE METABOLIC PANEL  I-STAT CREATININE, ED     EKG ED ECG REPORT I, 03/13/22, the attending physician, personally viewed and interpreted this ECG.   Date: 01/11/2022  EKG Time: 12:27  Rate: 70  Rhythm: sinus  Axis: normal  Intervals: normal  ST&T Change: no stemi, no depressions    RADIOLOGY Please see ED Course for my review and interpretation.  I personally reviewed all radiographic images ordered to evaluate for the above acute complaints and reviewed radiology reports and findings.  These findings were personally discussed  with the patient.  Please see medical record for radiology report.    PROCEDURES:  Critical Care performed: No  Procedures   MEDICATIONS ORDERED IN ED: Medications  sodium chloride flush (NS) 0.9 % injection 3 mL (3 mLs Intravenous Given 01/11/22 1300)  levETIRAcetam (KEPPRA) IVPB 1000 mg/100 mL premix (1,000 mg Intravenous New Bag/Given 01/11/22 1340)     IMPRESSION / MDM / ASSESSMENT AND PLAN / ED COURSE  I reviewed the triage vital signs and the nursing notes.                              Differential diagnosis includes, but is not limited to, cva, tia, hypoglycemia, dehydration, electrolyte abnormality, dissection, sepsis   Patient presented to ER for evaluation of symptoms as described above been a code stroke in route.  Protecting her airway.  Taken emergently  to CT imaging.  This presenting complaint could reflect a potentially life-threatening illness therefore the patient will be placed on continuous pulse oximetry and telemetry for monitoring.  Laboratory evaluation will be sent to evaluate for the above complaints.     Clinical Course as of 01/11/22 1428  Sun Jan 11, 2022  1229 CT head on my interpretation does not show any evidence of acute bleed.  Case discussed in consultation with neurology at bedside he does recommend admission to the hospitalist for EEG and MRI. [PR]  1427 Case discussed in consultation with hospitalist who is excepted patient to their service. [PR]    Clinical Course User Index [PR] Willy Eddy, MD    Patient's presentation is most consistent with acute presentation with potential threat to life or bodily function.   FINAL CLINICAL IMPRESSION(S) / ED DIAGNOSES   Final diagnoses:  Right sided weakness     Rx / DC Orders   ED Discharge Orders     None        Note:  This document was prepared using Dragon voice recognition software and may include unintentional dictation errors.       Willy Eddy, MD 01/11/22 1428

## 2022-01-11 NOTE — Assessment & Plan Note (Addendum)
Stroke-like symptoms: Word finding difficulty, acute onset of right-sided weakness and numbness - CT head without contrast read as: No acute intracranial process - Neurology service was consulted and recommended admission to hospitalist service, EEG, MRI of the brain without contrast. --MRI brain negative for stroke. --EEG was canceled due to known seizure history and results would not change management. --Cleared by PT, OT, SLP without therapy needs -- Echocardiogram showed EF 55 to [id number], normal diastolic parameters, no significant valvular disease.  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   Neurology recommended continuing Keppra and aspirin at discharge and follow-up with her outpatient neurologist.   No driving until cleared by neurology.

## 2022-01-11 NOTE — Assessment & Plan Note (Addendum)
History of seizures and unreliably takes her p.m. Keppra.  Loaded with IV Keppra in the ED. No seizure activity seen during course of admission. --Continue Keppra 1000 mg p.o. twice daily -- Outpatient neurology follow-up

## 2022-01-11 NOTE — Consult Note (Signed)
NEURO HOSPITALIST CONSULT NOTE   Requesting physician: Dr. Quentin Cornwall  Reason for Consult: Acute worsening of right sided weakness  History obtained from:  EMS, Patient and Chart     HPI:                                                                                                                                          Robin Mosley is an 66 y.o. 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 LKN was 11 AM. With EMS, BP was initially 260/120 then 141/108, with HR 77 and normal O2 sats. CBG was 110.  On arrival to the ED, she continued to have the same deficits. She denied CP, SOB, nausea and vomiting.   Past Medical History:  Diagnosis Date   Diverticulitis    HLD (hyperlipidemia)    HTN (hypertension)    IBS (irritable bowel syndrome)    Seasonal allergies    Stroke ([place])    Tobacco abuse     Past Surgical History:  Procedure Laterality Date   CARPAL TUNNEL RELEASE     CESAREAN SECTION     CHOLECYSTECTOMY     TEE WITHOUT CARDIOVERSION N/A 10/06/2019   Procedure: TRANSESOPHAGEAL ECHOCARDIOGRAM (TEE);  Surgeon: Minna Merritts, MD;  Location: [facility] ORS;  Service: Cardiovascular;  Laterality: N/A;    Family History  Problem Relation Age of Onset   Hypertension Mother    Hypertension Sister    Heart disease Mother             Social History:  reports that she has been smoking cigarettes. She has never used smokeless tobacco. She reports that she does not drink alcohol and does not use drugs.  Allergies  Allergen Reactions   Elemental Sulfur     Sulfa Antibiotics Hives   Sulfonylureas    Tetracyclines & Related Hives   Tetracyclines & Related     MEDICATIONS:  Prior to Admission:  Medications Prior to Admission  Medication Sig Dispense Refill Last Dose   amLODipine (NORVASC) 5 MG tablet Take 5 mg by mouth daily.   01/11/2022 at 800   aspirin 81 MG chewable tablet Chew 81 mg by mouth daily.   01/11/2022 at 800   levETIRAcetam (KEPPRA) 1000 MG tablet Take 1 tablet (1,000 mg total) by mouth 2 (two) times daily. 60 tablet 6 01/11/2022 at 800     ROS:                                                                                                                                       As per HPI. Does not endorse any additional symptoms.    Blood pressure 139/68, pulse 72, temperature 98.4 F (36.9 C), temperature source Oral, resp. rate 15, height 5\' 2"  (1.575 m), weight 72.6 kg, SpO2 100 %.   General Examination:                                                                                                       Physical Exam  HEENT-  [place]/AT   Lungs- Respirations unlabored Extremities- No edema  Neurological Examination Mental Status: Awake and alert. Speech is halting with frequent pauses for word finding. Naming is intact. Able to follow all simple commands but had difficulty with a 3 step command. There was some distractibility, with patient's speech becoming more fluent when she was elaborating or clarifying something just said, then becoming less fluent when examiner focuses on speech testing specifically. Was able to name a thumb, pinky and forefinger with some difficulty, stuttering the words out slowly with a quality suggestive of embellishment. Oriented to the city, state, year, month and day of the week, with stuttering responses similar to the naming questions - short sentences containing words without  stuttering were then noted subsequently when asked open-ended questions.  Cranial Nerves: II: Visual fields intact. PERRL.  III,IV, VI: No ptosis. EOMI. No nystagmus.  V: Temp sensation decreased on the right VII: Smile symmetric VIII: Hearing intact to voice IX,X: No hypophonia or hoarseness XI: Head is midline. No significant lag on the right with shoulder shrug XII: Midline tongue extension Motor: RUE with inconsistent resistance and giveway weakness. Maximum strength elicited is 4/5 RLE with inconsistent resistance and giveway weakness. Maximum strength elicited is 4/5 LUE 5/5 LLE 5/5 RUE drifts to a position lower than left when arms held out against gravity, but stops and does not continue  to drift.  Sensory: Decreased temp sensation to RUE and RLE, but can feel the touch sensation. When testing FT and pressure, is apparently insensate to RUE despite being able to feel cool touch stimulus seconds earlier.  Deep Tendon Reflexes: 1+ left brachioradialis and biceps. 3+ right brachioradialis and biceps. 2+ left patellar, 3+ right patellar.   Plantars: Upgoing bilaterally  Cerebellar: No ataxia with FNF bilaterally, but slow bilaterally, significantly more so on the right. Will slowly move right index finger towards examiner's finger, then nearly stop when tip of patient's finger is about 1 inch away, then proceeds the last inch over about 10 seconds, which worsens with coaching. Similar quality on the left, but not as slow. Findings appear non-physiological and most consistent with embellishment.  Gait: Deferred   Lab Results: Basic Metabolic Panel: Recent Labs  Lab 01/11/22 1232  NA 139  K 3.8  CL 104  CO2 31  GLUCOSE 92  BUN 9  CREATININE 0.58  CALCIUM 8.7*    CBC: Recent Labs  Lab 01/11/22 1232  WBC 6.9  NEUTROABS 4.3  HGB 13.1  HCT 40.1  MCV 85.0  PLT 259    Cardiac Enzymes: No results for input(s): CKTOTAL, CKMB, CKMBINDEX, TROPONINI in the last 168  hours.  Lipid Panel: No results for input(s): CHOL, TRIG, HDL, CHOLHDL, VLDL, LDLCALC in the last 168 hours.  Imaging: CT HEAD CODE STROKE WO CONTRAST  Result Date: 01/11/2022 CLINICAL DATA:  Code stroke.  Right-sided numbness and weakness EXAM: CT HEAD WITHOUT CONTRAST TECHNIQUE: Contiguous axial images were obtained from the base of the skull through the vertex without intravenous contrast. RADIATION DOSE REDUCTION: This exam was performed according to the departmental dose-optimization program which includes automated exposure control, adjustment of the mA and/or kV according to patient size and/or use of iterative reconstruction technique. COMPARISON:  10/28/2019 FINDINGS: Brain: No evidence of acute infarction, hemorrhage, cerebral edema, mass, mass effect, or midline shift. Ventricles and sulci are normal for age. No extra-axial fluid collection. Encephalomalacia in the left superior temporal, posterior frontal, and anterior temporal lobes from prior left MCA territory infarct. Vascular: No hyperdense vessel. Skull: Negative for fracture or focal lesion. Sinuses/Orbits: No acute finding. Other: The mastoid air cells are well aerated. ASPECTS [facility] Stroke Program Early CT Score) - Ganglionic level infarction (caudate, lentiform nuclei, internal capsule, insula, M1-M3 cortex): 7 - Supraganglionic infarction (M4-M6 cortex): 3 Total score (0-10 with 10 being normal): 10 IMPRESSION: 1. No acute intracranial process. 2. ASPECTS is 10 Code stroke imaging results were communicated on 01/11/2022 at 12:08 pm to provider Dr. Cheral Marker via telephone, who verbally acknowledged these results. Electronically Signed   By: Merilyn Baba M.D.   On: 01/11/2022 12:09     Assessment: 66 y.o. 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 LKN was 11 AM. With EMS, BP was initially 260/120 then 141/108, with HR 77 and normal O2 sats. CBG was 110. - Exam reveals brisker right sided reflexes consistent with her old left MCA stroke, but remaining exam findings are inconsistent, some of which are accompanied  by embellishment.  - CT head: Encephalomalacia in the left superior temporal, posterior frontal, and anterior temporal lobes from prior left MCA territory infarct. No acute abnormality seen.  - DDx includes subclinical seizure with postictal confusion and Todd's paresis, hypertensive emergency with recrudescence of latent deficits, small acute stroke and conversion disorder or secondary gain.  - After discussion of risks/benefits of TNK administration, including risk of hemorrhage and death in the context of there being a less than 30% chance of improvement given that components of the DDx other than stroke are relatively likely, the patient made informed decision not to be treated with TNK. All questions answered.  - Based on exam findings, the patient has capacity to make her own medical decisions at this time.   Recommendations: 1. MRI brain  2. EEG 3. Frequent neuro checks 4. Continue her home ASA 5. Continue home Keppra 6. BP management with SBP goal of 150-170. BP goal is to address ambiguity of her clinical picture, with hypertensive urgency versus stroke/TIA being high on the DDx. Rationale is to keep BP low enough to manage possible hypertensive urgency while also maintaining BP above the normal range towards the goal of avoiding cerebral hypoperfusion.    Electronically signed: Dr. Kerney Elbe 01/11/2022, 2:27 PM

## 2022-01-11 NOTE — ED Notes (Signed)
CBG 114 

## 2022-01-11 NOTE — ED Triage Notes (Signed)
Pt bib ems from home c/o right sided numbness and weakness. LNK 11AM today. H/O stroke with rt sided weakness, seizure on seizure med.   Noted trouble finding word/ finishing sentence. Per pt, she called her friend and friend told her to call EMS; pt called EMS.   VS per ems: 260/120--> 141/108 HR 77 SPO2 97% RA CBG 110

## 2022-01-11 NOTE — Assessment & Plan Note (Signed)
-   Patient currently smokes half a pack per day and she is not ready to quit - Nicotine patch as needed ordered

## 2022-01-11 NOTE — Progress Notes (Signed)
SLP Cancellation Note  Patient Details Name: Robin ARSENEAULT MRN: [id number] DOB: 19-Jun-1956   Cancelled treatment:       Reason Eval/Treat Not Completed: Patient at procedure or test/unavailable   SLP consult received and appreciated. Chart review completed. Cognitive-linguistic evaluation unable to be completed at pt OTF for MRI. Will continue efforts as appropriate.   Clyde Canterbury, M.S., CCC-SLP Speech-Language Pathologist [facility] [phone] Arnette Felts)  Woodroe Chen 01/11/2022, 3:46 PM

## 2022-01-12 ENCOUNTER — Ambulatory Visit (INDEPENDENT_AMBULATORY_CARE_PROVIDER_SITE_OTHER): Payer: Medicare Other

## 2022-01-12 ENCOUNTER — Observation Stay (HOSPITAL_BASED_OUTPATIENT_CLINIC_OR_DEPARTMENT_OTHER)
Admit: 2022-01-12 | Discharge: 2022-01-12 | Disposition: A | Discharge status: Home / Self Care | Payer: Medicare Other | Attending: Internal Medicine | Admitting: Internal Medicine

## 2022-01-12 DIAGNOSIS — R531 Weakness: Secondary | ICD-10-CM | POA: Diagnosis not present

## 2022-01-12 DIAGNOSIS — I10 Essential (primary) hypertension: Secondary | ICD-10-CM | POA: Diagnosis not present

## 2022-01-12 DIAGNOSIS — I639 Cerebral infarction, unspecified: Secondary | ICD-10-CM | POA: Diagnosis not present

## 2022-01-12 LAB — ECHOCARDIOGRAM COMPLETE
AR max vel: 2.45 cm2
AV Area VTI: 2.33 cm2
AV Area mean vel: 2.11 cm2
AV Mean grad: 4 mmHg
AV Peak grad: 7.5 mmHg
Ao pk vel: 1.37 m/s
Area-P 1/2: 3.48 cm2
Height: 62 in
MV VTI: 2.47 cm2
S' Lateral: 2.52 cm
Weight: 2560 oz

## 2022-01-12 LAB — HEMOGLOBIN A1C
Hgb A1c MFr Bld: 5.5 % (ref 4.8–5.6)
Mean Plasma Glucose: 111.15 mg/dL

## 2022-01-12 LAB — LIPID PANEL
Cholesterol: 258 mg/dL — ABNORMAL HIGH (ref 0–200)
HDL: 49 mg/dL (ref >40)
LDL Cholesterol: 192 mg/dL — ABNORMAL HIGH (ref 0–99)
Total CHOL/HDL Ratio: 5.3 RATIO
Triglycerides: 83 mg/dL (ref <150)
VLDL: 17 mg/dL (ref 0–40)

## 2022-01-12 LAB — HIV ANTIBODY (ROUTINE TESTING W REFLEX): HIV Screen 4th Generation wRfx: NONREACTIVE

## 2022-01-12 LAB — VITAMIN B12: Vitamin B-12: 533 pg/mL (ref 180–914)

## 2022-01-12 NOTE — Plan of Care (Addendum)
Neurology plan of care  MRI brain no acute stroke. Per chart review and discussion with her hospitalist it appears she is back to her baseline. If that is the case she does not need any further workup and does not need to be seen by me prior to discharge. Please counsel her on importance of medication adherence, she frequently skips her evening dose of keppra. Continue home does of keppra 1000mg  bid at discharge as well as aspirin. I canceled EEG, we know she has seizures and it would not change mgmt. She may f/u with her established outpatient neurologist. No new driving restrictions since there was no alteration of consciousness with this event (if she has driving restrictions 2/2 another seizure in the past 6 mos those should be continued).  Su Monks, MD [facility] [phone]  If 7pm- 7am, please page neurology on call as listed in [place].

## 2022-01-12 NOTE — Evaluation (Addendum)
Physical Therapy Evaluation Patient Details Name: Robin Mosley MRN: [id number] DOB: 23-Nov-1955 Today's Date: 01/12/2022  History of Present Illness  Patient is a 66 year old female who presented to the emergency department with concerns of sudden onset right-sided weakness and numbness with word finding difficulties.  Medical history significant for tobacco abuse, history of left MCA stroke with left superior temporal encephalomalacia, hypertension, seizures, hyperlipidemia. MRI of brain reports no restricted diffusion to suggest acute or subacute infarct  Clinical Impression  Patient is agreeable to PT evaluation. She reports she had an old stroke and has residual mild decreased sensation in her right arm and leg at baseline as well as intermittent difficulty wornd finding. Today, these symptoms appear to be at her baseline per her report. No focal weakness was noted. She is mobilizing independently with bed mobility and transfers. Modified independent with hallway ambulation without assistive device, mildly decreased gait speed but no assistive device required. The patient is at her baseline level of functional mobility. No PT needs are identified at this time.       Recommendations for follow up therapy are one component of a multi-disciplinary discharge planning process, led by the attending physician.  Recommendations may be updated based on patient status, additional functional criteria and insurance authorization.  Follow Up Recommendations No PT follow up    Assistance Recommended at Discharge None  Patient can return home with the following       Equipment Recommendations None recommended by PT  Recommendations for Other Services       Functional Status Assessment Patient has not had a recent decline in their functional status     Precautions / Restrictions Precautions Precautions: None Restrictions Weight Bearing Restrictions: No      Mobility  Bed Mobility Overal bed  mobility: Independent                  Transfers Overall transfer level: Independent                      Ambulation/Gait Ambulation/Gait assistance: Modified independent (Device/Increase time) (Mod I due to increased time) Gait Distance (Feet): 190 Feet Assistive device: None Gait Pattern/deviations: Step-through pattern Gait velocity: mildly decreased     General Gait Details: mildly decreased gait velocity with no loss of balance without assistive device.  Stairs            Wheelchair Mobility    Modified Rankin (Stroke Patients Only)       Balance                                             Pertinent Vitals/Pain Pain Assessment Pain Assessment: No/denies pain    Home Living Family/patient expects to be discharged to:: Private residence Living Arrangements: Alone Available Help at Discharge: Friend(s);Available PRN/intermittently Type of Home: House Home Access: Stairs to enter   Entrance Stairs-Number of Steps: 2   Home Layout: One level Home Equipment: Agricultural consultant (2 wheels);Cane - single point      Prior Function Prior Level of Function : Independent/Modified Independent;Driving             Mobility Comments: independent ADLs Comments: independent     Hand Dominance   Dominant Hand: Right    Extremity/Trunk Assessment   Upper Extremity Assessment Upper Extremity Assessment: Overall WFL for tasks assessed (5/5 shoulder flexion,  elbow flexion/extension. mild diminished sensation reported in RUE which is her baseline from old stroke per her report.)    Lower Extremity Assessment Lower Extremity Assessment: Overall WFL for tasks assessed;RLE deficits/detail RLE Deficits / Details: 5/5 hip abd/add, knee extension, hip flexion, dorsiflexion/plantarflexion RLE Sensation:  (diminished per patient report since last stroke. she reports it  unchanged from baseline) RLE Coordination: WNL LLE Deficits /  Details: 5/5 hip abd/add, knee extension, hip flexion, dorsiflexion/plantarflexion LLE Sensation: WNL LLE Coordination: WNL       Communication   Communication: No difficulties  Cognition Arousal/Alertness: Awake/alert Behavior During Therapy: WFL for tasks assessed/performed Overall Cognitive Status: Within Functional Limits for tasks assessed                                 General Comments: patient is following all commands without difficulty. she has intermittent difficulty with word finding which she reports she has had since previous stroke earlier this year        General Comments General comments (skin integrity, edema, etc.): encourgaed routine walking and progressing activity slowly to maintain strength and for upright conditioning at discharge.    Exercises     Assessment/Plan    PT Assessment Patient does not need any further PT services  PT Problem List         PT Treatment Interventions      PT Goals (Current goals can be found in the Care Plan section)  Acute Rehab PT Goals Patient Stated Goal: to go home today PT Goal Formulation: All assessment and education complete, DC therapy Time For Goal Achievement: 01/12/22    Frequency       Co-evaluation               AM-PAC PT "6 Clicks" Mobility  Outcome Measure Help needed turning from your back to your side while in a flat bed without using bedrails?: None Help needed moving from lying on your back to sitting on the side of a flat bed without using bedrails?: None Help needed moving to and from a bed to a chair (including a wheelchair)?: None Help needed standing up from a chair using your arms (e.g., wheelchair or bedside chair)?: None Help needed to walk in hospital room?: None Help needed climbing 3-5 steps with a railing? : None 6 Click Score: 24    End of Session   Activity Tolerance: Patient tolerated treatment well Patient left: in bed   PT Visit Diagnosis: Other  symptoms and signs involving the nervous system (E01.007)    Time: 1219-7588 PT Time Calculation (min) (ACUTE ONLY): 29 min   Charges:   PT Evaluation $PT Eval Low Complexity: 1 Low          Donna Bernard, PT, MPT   Ina Homes 01/12/2022, 11:44 AM

## 2022-01-12 NOTE — Discharge Summary (Signed)
Physician Discharge Summary   Patient: Robin Mosley MRN: [id number] DOB: 12/09/1955  Admit date:     01/11/2022  Discharge date: 01/23/22  Discharge Physician: Ezekiel Slocumb   PCP: Patient, No Pcp Per   Recommendations at discharge:    Follow up outpatient with neurology as scheduled Follow up with Primary care in 1-2 weeks Repeat CBC, BMP in 1-2 weeks  Discharge Diagnoses: Principal Problem:   Right sided weakness Active Problems:   Seizure ([place])   HTN (hypertension)   HLD (hyperlipidemia)   Tobacco abuse  Resolved Problems:   * No resolved hospital problems. 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  Initial vitals in the emergency department showed temperature 98.4, respiration rate of 17, heart rate of 70, blood pressure 154/82, SPO2 100% on room air.  Serum sodium is 139, potassium 3.8, chloride of 104, bicarb of 31, BUN of 9, serum creatinine of 0.58, GFR greater than 60, nonfasting blood glucose 92, WBC 6.9, hemoglobin 13.1, platelets of 259.  Code stroke was initiated  Imaging with CT of the head without contrast was read as no acute intracranial process.  Neurology service has been consulted and recommended hospital admission for EEG and MRI.  ED treatment: Keppra 1000 mg IV one-time loading dose.  Assessment and Plan: * Right sided weakness Stroke-like symptoms: Word finding difficulty, acute onset of right-sided weakness and numbness - CT head without contrast read as: No acute intracranial process - Neurology service was consulted and recommended admission to hospitalist service, EEG, MRI of the brain without contrast. --MRI brain negative for  stroke. --EEG was canceled due to known seizure history and results would not change management. --Cleared by PT, OT, SLP without therapy needs -- Echocardiogram showed EF 55 to [id number], normal diastolic parameters, no significant valvular disease.  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   Neurology recommended continuing Keppra and aspirin at discharge and follow-up with her outpatient neurologist.   No driving until cleared by neurology.    Tobacco abuse - Patient currently smokes half a pack per day and she is not ready to quit - Nicotine patch as needed ordered  HLD (hyperlipidemia) Not on statin  HTN (hypertension) - Home amlodipine 5 mg daily has been held for permissive hypertension. -- Resumed on amlodipine at d/c   Seizure [facility]) History of seizures and unreliably takes her p.m. Keppra.  Loaded with IV Keppra in the ED. No seizure activity seen during course of admission. --Continue Keppra 1000 mg p.o. twice daily -- Outpatient neurology follow-up          Consultants: Neurology Procedures performed: Echocardiogram Disposition: Home Diet recommendation:  Discharge Diet Orders (From admission, onward)     Start     Ordered   01/12/22 0000  Diet - low sodium heart healthy        01/12/22 1419           Regular diet DISCHARGE MEDICATION: Allergies as of 01/12/2022       Reactions   Elemental Sulfur    Sulfa Antibiotics Hives   Sulfonylureas    Tetracyclines & Related Hives  Tetracyclines & Related         Medication List     TAKE these medications    amLODipine 5 MG tablet Commonly known as: NORVASC Take 5 mg by mouth daily.   aspirin 81 MG chewable tablet Chew 81 mg by mouth daily.   levETIRAcetam 1000 MG tablet Commonly known as: KEPPRA Take 1 tablet (1,000  mg total) by mouth 2 (two) times daily.        Discharge Exam: Filed Weights   01/11/22 1225  Weight: 72.6 kg   General exam: awake, alert, no acute distress HEENT: moist mucus membranes, hearing grossly normal  Respiratory system: CTAB, on room air, normal respiratory effort. Cardiovascular system: normal S1/S2,  RRR, no edema.   Gastrointestinal system: soft, NT, ND Central nervous system: A&O x4. Subtle RUE weakness in grip, normal speech Extremities: moves all, no edema, normal tone Skin: dry, intact, normal temperature Psychiatry: normal mood, congruent affect, judgement and insight appear normal   Condition at discharge: stable  The results of significant diagnostics from this hospitalization (including imaging, microbiology, ancillary and laboratory) are listed below for reference.   Imaging Studies: CUP PACEART REMOTE DEVICE CHECK  Result Date: 01/13/2022 ILR summary report received. Battery status OK. Normal device function. No new symptom, tachy, brady, or pause episodes. No new AF episodes. Monthly summary reports and ROV/PRN LA  ECHOCARDIOGRAM COMPLETE  Result Date: 01/12/2022    ECHOCARDIOGRAM REPORT   Patient Name:   Robin Mosley Date of Exam: 01/12/2022 Medical Rec #:  [id number]      Height:       62.0 in Accession #:    [id number]     Weight:       160.0 lb Date of Birth:  [birth year]      BSA:          1.739 m Patient Age:    74 years       BP:           131/51 mmHg Patient Gender: F              HR:           73 bpm. Exam Location:  [facility] Procedure: 2D Echo, Cardiac Doppler and Color Doppler Indications:     I63.9 Stroke  History:         Patient has prior history of Echocardiogram examinations. Loop                  recorder; Risk Factors:Hypertension, Dyslipidemia and Current                  Smoker.  Sonographer:     Rosalia Hammers Referring Phys:  [id number] AMY N COX Diagnosing Phys: Kathlyn Sacramento MD  Sonographer Comments: Image acquisition challenging due to  respiratory motion. IMPRESSIONS  1. Left ventricular ejection fraction, by estimation, is 55 to 60%. The left ventricle has normal function. The left ventricle has no regional wall motion abnormalities. There is mild left ventricular hypertrophy. Left ventricular diastolic parameters were normal.  2. Right ventricular systolic function is normal. The right ventricular size is normal.  3. The mitral valve is normal in structure. No evidence of mitral valve regurgitation. No evidence of mitral stenosis.  4. The aortic valve is normal in structure. Aortic valve regurgitation is not visualized. No aortic stenosis is present.  5. The inferior vena cava is normal in size with greater than 50% respiratory variability, suggesting right atrial pressure of 3 mmHg.  FINDINGS  Left Ventricle: Left ventricular ejection fraction, by estimation, is 55 to 60%. The left ventricle has normal function. The left ventricle has no regional wall motion abnormalities. The left ventricular internal cavity size was normal in size. There is  mild left ventricular hypertrophy. Left ventricular diastolic parameters were normal. Right Ventricle: The right ventricular size is normal. No increase in right ventricular wall thickness. Right ventricular systolic function is normal. Left Atrium: Left atrial size was normal in size. Right Atrium: Right atrial size was normal in size. Pericardium: There is no evidence of pericardial effusion. Mitral Valve: The mitral valve is normal in structure. No evidence of mitral valve regurgitation. No evidence of mitral valve stenosis. MV peak gradient, 3.0 mmHg. The mean mitral valve gradient is 1.0 mmHg. Tricuspid Valve: The tricuspid valve is normal in structure. Tricuspid valve regurgitation is not demonstrated. No evidence of tricuspid stenosis. Aortic Valve: The aortic valve is normal in structure. Aortic valve regurgitation is not visualized. No aortic stenosis is present. Aortic valve mean gradient  measures 4.0 mmHg. Aortic valve peak gradient measures 7.5 mmHg. Aortic valve area, by VTI measures 2.33 cm. Pulmonic Valve: The pulmonic valve was normal in structure. Pulmonic valve regurgitation is not visualized. No evidence of pulmonic stenosis. Aorta: The aortic root is normal in size and structure. Venous: The inferior vena cava is normal in size with greater than 50% respiratory variability, suggesting right atrial pressure of 3 mmHg. IAS/Shunts: No atrial level shunt detected by color flow Doppler.  LEFT VENTRICLE PLAX 2D LVIDd:         3.61 cm   Diastology LVIDs:         2.52 cm   LV e' medial:    8.70 cm/s LV PW:         1.36 cm   LV E/e' medial:  8.2 LV IVS:        1.04 cm   LV e' lateral:   12.70 cm/s LVOT diam:     2.00 cm   LV E/e' lateral: 5.6 LV SV:         75 LV SV Index:   43 LVOT Area:     3.14 cm  RIGHT VENTRICLE RV Basal diam:  2.91 cm RV S prime:     11.90 cm/s LEFT ATRIUM             Index        RIGHT ATRIUM           Index LA diam:        2.90 cm 1.67 cm/m   RA Area:     10.80 cm LA Vol (A2C):   46.0 ml 26.46 ml/m  RA Volume:   19.60 ml  11.27 ml/m LA Vol (A4C):   41.1 ml 23.64 ml/m LA Biplane Vol: 44.5 ml 25.59 ml/m  AORTIC VALVE                    PULMONIC VALVE AV Area (Vmax):    2.45 cm     PV Vmax:       0.84 m/s AV Area (Vmean):   2.11 cm     PV Vmean:      62.300 cm/s AV Area (VTI):     2.33 cm     PV VTI:        0.201 m AV Vmax:           137.00 cm/s  PV Peak grad:  2.8 mmHg AV Vmean:  98.700 cm/s  PV Mean grad:  2.0 mmHg AV VTI:            0.321 m AV Peak Grad:      7.5 mmHg AV Mean Grad:      4.0 mmHg LVOT Vmax:         107.00 cm/s LVOT Vmean:        66.400 cm/s LVOT VTI:          0.238 m LVOT/AV VTI ratio: 0.74  AORTA Ao Root diam: 2.70 cm MITRAL VALVE MV Area (PHT): 3.48 cm    SHUNTS MV Area VTI:   2.47 cm    Systemic VTI:  0.24 m MV Peak grad:  3.0 mmHg    Systemic Diam: 2.00 cm MV Mean grad:  1.0 mmHg MV Vmax:       0.86 m/s MV Vmean:      52.0 cm/s MV  Decel Time: 218 msec MV E velocity: 71.60 cm/s MV A velocity: 65.60 cm/s MV E/A ratio:  1.09 Kathlyn Sacramento MD Electronically signed by Kathlyn Sacramento MD Signature Date/Time: 01/12/2022/12:26:38 PM    Final    MR BRAIN WO CONTRAST  Result Date: 01/11/2022 CLINICAL DATA:  Right-sided numbness and weakness EXAM: MRI HEAD WITHOUT CONTRAST TECHNIQUE: Multiplanar, multiecho pulse sequences of the brain and surrounding structures were obtained without intravenous contrast. COMPARISON:  10/05/2019 MRI, correlation is also made with 01/11/2022 head CT FINDINGS: Brain: No restricted diffusion to suggest acute or subacute infarct. No acute hemorrhage, mass, mass effect, or midline shift. No hydrocephalus or extra-axial collection. Encephalomalacia in the left temporal, posterior frontal, and anterior parietal lobes from remote left MCA territory infarct. Scattered T2 hyperintense signal in the periventricular white matter, likely the sequela of chronic small vessel ischemic disease. Vascular: Normal flow voids. Skull and upper cervical spine: Normal marrow signal. Sinuses/Orbits: No acute finding. Other: The mastoids are well aerated. IMPRESSION: No acute intracranial process. Electronically Signed   By: Merilyn Baba M.D.   On: 01/11/2022 16:16   DG Chest Port 1 View  Result Date: 01/11/2022 CLINICAL DATA:  [id number] EXAM: PORTABLE CHEST 1 VIEW COMPARISON:  None Available. FINDINGS: Evaluation is limited by patient rotation. The cardiomediastinal silhouette is normal in contour.Cardiac loop recorder. No pleural effusion. No pneumothorax. Questioned nodular opacity in the LEFT mid lung. Visualized abdomen is unremarkable. IMPRESSION: 1. Possible small nodular opacity in LEFT mid lung. This is nonspecific and could reflect summation artifact. Consider further evaluation with PA and lateral chest radiograph for improved evaluation when clinically appropriate. Electronically Signed   By: Valentino Saxon M.D.   On: 01/11/2022  15:12   CT HEAD CODE STROKE WO CONTRAST  Result Date: 01/11/2022 CLINICAL DATA:  Code stroke.  Right-sided numbness and weakness EXAM: CT HEAD WITHOUT CONTRAST TECHNIQUE: Contiguous axial images were obtained from the base of the skull through the vertex without intravenous contrast. RADIATION DOSE REDUCTION: This exam was performed according to the departmental dose-optimization program which includes automated exposure control, adjustment of the mA and/or kV according to patient size and/or use of iterative reconstruction technique. COMPARISON:  10/28/2019 FINDINGS: Brain: No evidence of acute infarction, hemorrhage, cerebral edema, mass, mass effect, or midline shift. Ventricles and sulci are normal for age. No extra-axial fluid collection. Encephalomalacia in the left superior temporal, posterior frontal, and anterior temporal lobes from prior left MCA territory infarct. Vascular: No hyperdense vessel. Skull: Negative for fracture or focal lesion. Sinuses/Orbits: No acute finding. Other: The mastoid air cells are well aerated.  ASPECTS [facility] Stroke Program Early CT Score) - Ganglionic level infarction (caudate, lentiform nuclei, internal capsule, insula, M1-M3 cortex): 7 - Supraganglionic infarction (M4-M6 cortex): 3 Total score (0-10 with 10 being normal): 10 IMPRESSION: 1. No acute intracranial process. 2. ASPECTS is 10 Code stroke imaging results were communicated on 01/11/2022 at 12:08 pm to provider Dr. Cheral Marker via telephone, who verbally acknowledged these results. Electronically Signed   By: Merilyn Baba M.D.   On: 01/11/2022 12:09    Microbiology: Results for orders placed or performed during the hospital encounter of 11/03/20  Resp Panel by RT-PCR (Flu A&B, Covid) Nasopharyngeal Swab     Status: None   Collection Time: 11/03/20 11:45 AM   Specimen: Nasopharyngeal Swab; Nasopharyngeal(NP) swabs in vial transport medium  Result Value Ref Range Status   SARS Coronavirus 2 by RT PCR NEGATIVE  NEGATIVE Final    Comment: (NOTE) SARS-CoV-2 target nucleic acids are NOT DETECTED.  The SARS-CoV-2 RNA is generally detectable in upper respiratory specimens during the acute phase of infection. The lowest concentration of SARS-CoV-2 viral copies this assay can detect is 138 copies/mL. A negative result does not preclude SARS-Cov-2 infection and should not be used as the sole basis for treatment or other patient management decisions. A negative result may occur with  improper specimen collection/handling, submission of specimen other than nasopharyngeal swab, presence of viral mutation(s) within the areas targeted by this assay, and inadequate number of viral copies(<138 copies/mL). A negative result must be combined with clinical observations, patient history, and epidemiological information. The expected result is Negative.  Fact Sheet for Patients:  EntrepreneurPulse.com.au  Fact Sheet for Healthcare Providers:  IncredibleEmployment.be  This test is no t yet approved or cleared by the [place] FDA and  has been authorized for detection and/or diagnosis of SARS-CoV-2 by FDA under an Emergency Use Authorization (EUA). This EUA will remain  in effect (meaning this test can be used) for the duration of the COVID-19 declaration under Section 564(b)(1) of the Act, 21 U.S.C.section 360bbb-3(b)(1), unless the authorization is terminated  or revoked sooner.       Influenza A by PCR NEGATIVE NEGATIVE Final   Influenza B by PCR NEGATIVE NEGATIVE Final    Comment: (NOTE) The Xpert Xpress SARS-CoV-2/FLU/RSV plus assay is intended as an aid in the diagnosis of influenza from Nasopharyngeal swab specimens and should not be used as a sole basis for treatment. Nasal washings and aspirates are unacceptable for Xpert Xpress SARS-CoV-2/FLU/RSV testing.  Fact Sheet for Patients: EntrepreneurPulse.com.au  Fact Sheet for Healthcare  Providers: IncredibleEmployment.be  This test is not yet approved or cleared by the [place] FDA and has been authorized for detection and/or diagnosis of SARS-CoV-2 by FDA under an Emergency Use Authorization (EUA). This EUA will remain in effect (meaning this test can be used) for the duration of the COVID-19 declaration under Section 564(b)(1) of the Act, 21 U.S.C. section 360bbb-3(b)(1), unless the authorization is terminated or revoked.  Performed at [facility], [address] [place], [place] [address]     Labs: CBC: No results for input(s): "WBC", "NEUTROABS", "HGB", "HCT", "MCV", "PLT" in the last 168 hours.  Basic Metabolic Panel: No results for input(s): "NA", "K", "CL", "CO2", "GLUCOSE", "BUN", "CREATININE", "CALCIUM", "MG", "PHOS" in the last 168 hours.  Liver Function Tests: No results for input(s): "AST", "ALT", "ALKPHOS", "BILITOT", "PROT", "ALBUMIN" in the last 168 hours.  CBG: No results for input(s): "GLUCAP" in the last 168 hours.   Discharge time spent:  greater than 30 minutes.  Signed: Ezekiel Slocumb, DO [facility] 01/23/2022

## 2022-01-12 NOTE — Evaluation (Signed)
Speech Language Pathology Evaluation Patient Details Name: Robin Mosley MRN: [id number] DOB: 03-31-1956 Today's Date: 01/12/2022 Time: 1300-1400 SLP Time Calculation (min) (ACUTE ONLY): 60 min  Problem List:  Patient Active Problem List   Diagnosis Date Noted   Right sided weakness 01/11/2022   Seizure (HCC) 11/03/2020   Acute metabolic encephalopathy 11/03/2020   HTN (hypertension)    HLD (hyperlipidemia)    Stroke (HCC)    Tobacco abuse    Acute ischemic stroke (HCC) 10/05/2019   Past Medical History:  Past Medical History:  Diagnosis Date   Anxiety    Bipolar disorder (HCC)    Depression    Diverticulitis    HLD (hyperlipidemia)    HTN (hypertension)    IBS (irritable bowel syndrome)    Psychosis (HCC)    Seasonal allergies    Stroke (HCC)    Tobacco abuse    Past Surgical History:  Past Surgical History:  Procedure Laterality Date   CARPAL TUNNEL RELEASE     CESAREAN SECTION     CHOLECYSTECTOMY     TEE WITHOUT CARDIOVERSION N/A 10/06/2019   Procedure: TRANSESOPHAGEAL ECHOCARDIOGRAM (TEE);  Surgeon: Antonieta Iba, MD;  Location: [facility] ORS;  Service: Cardiovascular;  Laterality: N/A;   HPI:  Pt is a 66 year old female with history of tobacco abuse, 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  MRI of brain reports no restricted diffusion to suggest acute or subacute infarct.   Assessment / Plan / Recommendation Clinical Impression  Pt seen today for informal assessment at bedside. Pt has a Baseline h/o Expressive Aphasia w/ Mild word-finding deficits per pt/chart history. Pt stated she feels she is close to/at her Baseline w/ her language abilities today; noted same reported in MD notes.  At this assessment, pt appears to present w/  Mild Motor Planning Speech deficits c/b Dysfluency and Mild+ Dysarthria, as well as mild word-finding challenges. This impacts her verbal communication at the conversation level. When pt utilized strategies of slowing rate of speech and over-articulating, and emphasizing speech sounds, pt's Dysfluency lessened and intelligibility increased. Practiced w/ pt moments when she would take a deep breath to relax then begin speaking again to allow her to complete a sentence/task w/ the correct word she wanted and w/ good articulation/clarity. Discussed the importance of sitting fully upright to support diaphragmatic breathing and breath support for speech. No gross expressive aphasia noted during general screening; pt exhibited a mild hesitation intermittently during sentence/conversation but was able to retrieve the word she wanted to use more that 75% of the time. She stated when she is unable to retrieve the word she wants to say, she thinks of another word "a lot like it, but sometimes it's not exactly what she wants" She stated she tells her Friends to let her think of it "first" b/f finishing the sentence for her. Highlighted her use of good language strategies and reviewed others including thinking/saying descriptors/characteristics of the word to aid recall. No receptive aphasia nor Cognitive deficits noted. OM exam was grossly wfl for lingual/labial strength/ROM/tone.  Discussed Fluency and Word-finding Strategies w/ pt; practiced and gave handouts. Recommend pt f/u w/ PCP for Outpatient or Home Health skilled ST servcies for strategies to improve communication in ADLs; education on reducing concerns of the Dysfluency. Pt agreed.   SLP Assessment  SLP  Recommendation/Assessment: All further Speech Lanaguage Pathology  needs can be addressed in the next venue of care SLP Visit Diagnosis: Aphasia (R47.01) (chronic, baseline)    Recommendations for follow up therapy are one component of a multi-disciplinary  discharge planning process, led by the attending physician.  Recommendations may be updated based on patient status, additional functional criteria and insurance authorization.    Follow Up Recommendations  Follow physician's recommendations for discharge plan and follow up therapies (Outpatient services as desired)    Assistance Recommended at Discharge  None  Functional Status Assessment Patient has not had a recent decline in their functional status (appears at her baseline per MD notes, pt report)  Frequency and Duration  (n/a)   (n/a)      SLP Evaluation Cognition  Overall Cognitive Status: Within Functional Limits for tasks assessed Arousal/Alertness: Awake/alert Orientation Level: Oriented X4 Year: 2023 Month: June Attention: Focused;Sustained Focused Attention: Appears intact Sustained Attention: Appears intact Memory: Appears intact Awareness: Appears intact Problem Solving: Appears intact Safety/Judgment: Appears intact       Comprehension  Auditory Comprehension Overall Auditory Comprehension: Appears within functional limits for tasks assessed Yes/No Questions: Within Functional Limits Commands: Within Functional Limits Conversation: Complex Other Conversation Comments: motor planning deficits noted Interfering Components:  (n/a) Visual Recognition/Discrimination Discrimination: Not tested Reading Comprehension Reading Status: Not tested    Expression Expression Primary Mode of Expression: Verbal Verbal Expression Overall Verbal Expression: Impaired at baseline Initiation: No impairment Automatic Speech: Name;Social Response;Day of week Level of Generative/Spontaneous Verbalization: Conversation Repetition: No impairment Naming: Impairment (paraphasia intermittently) Responsive: 76-100% accurate Confrontation: Impaired Convergent: 75-100% accurate Verbal Errors: Phonemic paraphasias;Aware of errors (self-corrects by slowing down and  over-articulating) Pragmatics: No impairment Effective Techniques: Articulatory cues Non-Verbal Means of Communication: Not applicable Other Verbal Expression Comments: motor planning issues Written Expression Dominant Hand: Right Written Expression: Not tested   Oral / Motor  Oral Motor/Sensory Function Overall Oral Motor/Sensory Function: Within functional limits (no unilateral weakness noted) Motor Speech Overall Motor Speech: Impaired at baseline Respiration: Within functional limits Phonation: Normal Resonance: Within functional limits Articulation: Impaired Level of Impairment: Conversation Intelligibility: Intelligibility reduced Conversation: 75-100% accurate Motor Planning: Impaired Level of Impairment: Press photographer Errors: Aware;Inconsistent Effective Techniques: Slow rate;Increased vocal intensity;Over-articulate                Jerilynn Som, MS, CCC-SLP Speech Language Pathologist Rehab Services; [facility] [phone] (ascom) Shyquan Stallbaumer 01/12/2022, 6:27 PM

## 2022-01-12 NOTE — Progress Notes (Addendum)
Patient being discharged home. Ivs removed. Went over discharge instructions with patient. Patient stated that she understood. Patient going home POV with friend Harriett Sine.

## 2022-01-12 NOTE — Progress Notes (Signed)
OT Cancellation Note  Patient Details Name: Robin Mosley MRN: [id number] DOB: 12-21-1955   Cancelled Treatment:    Reason Eval/Treat Not Completed: OT screened, no needs identified, will sign off. Per nurse and PT, pt is back at baseline level of functioning with no further concerns. OT to complete order.  Jackquline Denmark, MS, OTR/L , CBIS ascom [phone]  01/12/22, 11:16 AM

## 2022-01-13 LAB — CUP PACEART REMOTE DEVICE CHECK
Date Time Interrogation Session: 20230531232304
Implantable Pulse Generator Implant Date: 20210315

## 2022-01-23 NOTE — Assessment & Plan Note (Signed)
Not on statin 

## 2022-01-27 NOTE — Progress Notes (Signed)
Carelink Summary Report / Loop Recorder 

## 2022-02-14 LAB — CUP PACEART REMOTE DEVICE CHECK
Date Time Interrogation Session: 20230703232011
Implantable Pulse Generator Implant Date: 20210315

## 2022-02-16 ENCOUNTER — Ambulatory Visit (INDEPENDENT_AMBULATORY_CARE_PROVIDER_SITE_OTHER): Payer: Medicare Other

## 2022-02-16 DIAGNOSIS — I639 Cerebral infarction, unspecified: Secondary | ICD-10-CM

## 2022-03-09 NOTE — Progress Notes (Signed)
Carelink Summary Report / Loop Recorder 

## 2022-03-19 LAB — CUP PACEART REMOTE DEVICE CHECK
Date Time Interrogation Session: 20230805232330
Implantable Pulse Generator Implant Date: 20210315

## 2022-03-23 ENCOUNTER — Ambulatory Visit: Payer: Medicaid Other

## 2022-04-27 ENCOUNTER — Ambulatory Visit (INDEPENDENT_AMBULATORY_CARE_PROVIDER_SITE_OTHER): Payer: Medicare Other

## 2022-04-27 DIAGNOSIS — I639 Cerebral infarction, unspecified: Secondary | ICD-10-CM | POA: Diagnosis not present

## 2022-04-29 LAB — CUP PACEART REMOTE DEVICE CHECK
Date Time Interrogation Session: 20230917230808
Implantable Pulse Generator Implant Date: 20210315

## 2022-05-12 NOTE — Progress Notes (Signed)
Carelink Summary Report / Loop Recorder 

## 2022-06-01 ENCOUNTER — Ambulatory Visit (INDEPENDENT_AMBULATORY_CARE_PROVIDER_SITE_OTHER): Payer: Medicare Other

## 2022-06-01 DIAGNOSIS — I639 Cerebral infarction, unspecified: Secondary | ICD-10-CM | POA: Diagnosis not present

## 2022-06-03 LAB — CUP PACEART REMOTE DEVICE CHECK
Date Time Interrogation Session: 20231020231133
Implantable Pulse Generator Implant Date: 20210315

## 2022-07-01 NOTE — Progress Notes (Signed)
Carelink Summary Report / Loop Recorder 

## 2022-07-07 ENCOUNTER — Ambulatory Visit (INDEPENDENT_AMBULATORY_CARE_PROVIDER_SITE_OTHER): Payer: Self-pay

## 2022-07-07 DIAGNOSIS — I639 Cerebral infarction, unspecified: Secondary | ICD-10-CM

## 2022-07-07 LAB — CUP PACEART REMOTE DEVICE CHECK
Date Time Interrogation Session: 20231127231454
Implantable Pulse Generator Implant Date: 20210315

## 2022-08-06 NOTE — Progress Notes (Signed)
Carelink Summary Report / Loop Recorder 

## 2022-08-11 ENCOUNTER — Ambulatory Visit (INDEPENDENT_AMBULATORY_CARE_PROVIDER_SITE_OTHER): Payer: 59

## 2022-08-11 DIAGNOSIS — I639 Cerebral infarction, unspecified: Secondary | ICD-10-CM

## 2022-08-11 LAB — CUP PACEART REMOTE DEVICE CHECK
Date Time Interrogation Session: 20240101230227
Implantable Pulse Generator Implant Date: 20210315

## 2022-09-13 LAB — CUP PACEART REMOTE DEVICE CHECK
Date Time Interrogation Session: 20240203230828
Implantable Pulse Generator Implant Date: 20210315

## 2022-09-14 ENCOUNTER — Ambulatory Visit: Payer: 59

## 2022-09-14 DIAGNOSIS — I639 Cerebral infarction, unspecified: Secondary | ICD-10-CM | POA: Diagnosis not present

## 2022-09-17 NOTE — Progress Notes (Signed)
Carelink Summary Report / Loop Recorder 

## 2022-10-16 LAB — CUP PACEART REMOTE DEVICE CHECK
Date Time Interrogation Session: 20240307231747
Implantable Pulse Generator Implant Date: 20210315

## 2022-10-19 ENCOUNTER — Ambulatory Visit (INDEPENDENT_AMBULATORY_CARE_PROVIDER_SITE_OTHER): Payer: 59

## 2022-10-19 DIAGNOSIS — I639 Cerebral infarction, unspecified: Secondary | ICD-10-CM | POA: Diagnosis not present

## 2022-11-02 NOTE — Progress Notes (Signed)
Carelink Summary Report / Loop Recorder 

## 2022-11-19 LAB — CUP PACEART REMOTE DEVICE CHECK
Date Time Interrogation Session: 20240410001841
Implantable Pulse Generator Implant Date: 20210315

## 2022-11-23 ENCOUNTER — Ambulatory Visit (INDEPENDENT_AMBULATORY_CARE_PROVIDER_SITE_OTHER): Payer: 59

## 2022-11-23 DIAGNOSIS — I639 Cerebral infarction, unspecified: Secondary | ICD-10-CM

## 2022-12-01 NOTE — Progress Notes (Signed)
Carelink Summary Report / Loop Recorder 

## 2022-12-23 LAB — CUP PACEART REMOTE DEVICE CHECK
Date Time Interrogation Session: 20240513001521
Implantable Pulse Generator Implant Date: 20210315

## 2022-12-28 ENCOUNTER — Ambulatory Visit (INDEPENDENT_AMBULATORY_CARE_PROVIDER_SITE_OTHER): Payer: 59

## 2022-12-28 DIAGNOSIS — I639 Cerebral infarction, unspecified: Secondary | ICD-10-CM | POA: Diagnosis not present

## 2022-12-31 NOTE — Progress Notes (Signed)
Carelink Summary Report / Loop Recorder 

## 2023-01-22 ENCOUNTER — Ambulatory Visit (INDEPENDENT_AMBULATORY_CARE_PROVIDER_SITE_OTHER): Payer: 59

## 2023-01-22 DIAGNOSIS — I639 Cerebral infarction, unspecified: Secondary | ICD-10-CM

## 2023-01-25 LAB — CUP PACEART REMOTE DEVICE CHECK
Date Time Interrogation Session: 20240615001821
Implantable Pulse Generator Implant Date: 20210315

## 2023-01-26 NOTE — Progress Notes (Signed)
Carelink Summary Report / Loop Recorder 

## 2023-02-01 ENCOUNTER — Ambulatory Visit: Payer: 59

## 2023-02-09 NOTE — Progress Notes (Signed)
Carelink Summary Report / Loop Recorder 

## 2023-02-24 ENCOUNTER — Ambulatory Visit (INDEPENDENT_AMBULATORY_CARE_PROVIDER_SITE_OTHER): Payer: 59

## 2023-02-24 DIAGNOSIS — I639 Cerebral infarction, unspecified: Secondary | ICD-10-CM | POA: Diagnosis not present

## 2023-02-25 LAB — CUP PACEART REMOTE DEVICE CHECK
Date Time Interrogation Session: 20240718002127
Implantable Pulse Generator Implant Date: 20210315

## 2023-03-08 ENCOUNTER — Ambulatory Visit: Payer: Medicaid Other

## 2023-03-11 NOTE — Progress Notes (Signed)
Carelink Summary Report / Loop Recorder 

## 2023-03-29 ENCOUNTER — Ambulatory Visit (INDEPENDENT_AMBULATORY_CARE_PROVIDER_SITE_OTHER): Payer: 59

## 2023-03-29 DIAGNOSIS — I639 Cerebral infarction, unspecified: Secondary | ICD-10-CM | POA: Diagnosis not present

## 2023-03-30 LAB — CUP PACEART REMOTE DEVICE CHECK
Date Time Interrogation Session: 20240820002417
Implantable Pulse Generator Implant Date: 20210315

## 2023-04-09 NOTE — Progress Notes (Signed)
Carelink Summary Report / Loop Recorder 

## 2023-04-22 ENCOUNTER — Telehealth: Payer: Self-pay

## 2023-04-22 NOTE — Telephone Encounter (Signed)
Attempted to call patient about ILR @ [facility] 04/21/2023. No answer, LMTCB.

## 2023-04-26 NOTE — Telephone Encounter (Signed)
Spoke with patient.  She has requested to keep the implanted device in for the time being, is not interested in explant.   Knows we will stop monitoring as battery has expired, remotes will be cancelled and we will mail her a return kit to send her remote monitor back in.   Patient allowed time to ask questions.  Nothing further at this time.

## 2023-05-03 ENCOUNTER — Ambulatory Visit: Payer: 59

## 2023-06-07 ENCOUNTER — Ambulatory Visit: Payer: 59

## 2023-07-12 ENCOUNTER — Ambulatory Visit: Payer: 59

## 2023-08-16 ENCOUNTER — Ambulatory Visit: Payer: 59

## 2023-10-17 IMAGING — CT CT HEAD CODE STROKE
4 series · 16 of 47 positions shown, 18 images · non-contrast
Comparison: 10/28/2019

CLINICAL DATA: Code stroke.  Right-sided numbness and weakness



[Series 3: head wo · axial · 0.39mm/px · z∈[-103,-8]mm · 7 of 27 slices shown, 9 images]
[im 4/27  brain]
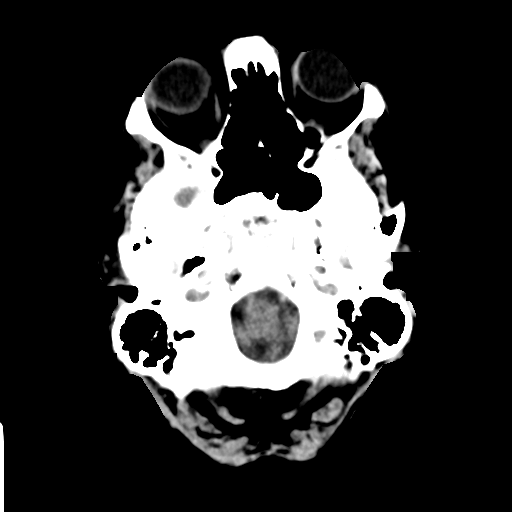
[im 4/27  bone]
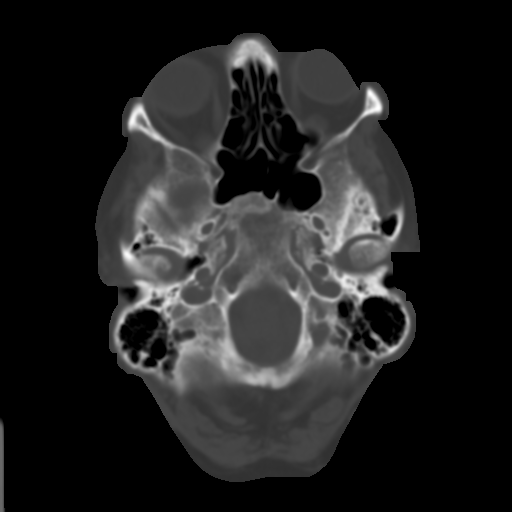
[im 7/27  brain]
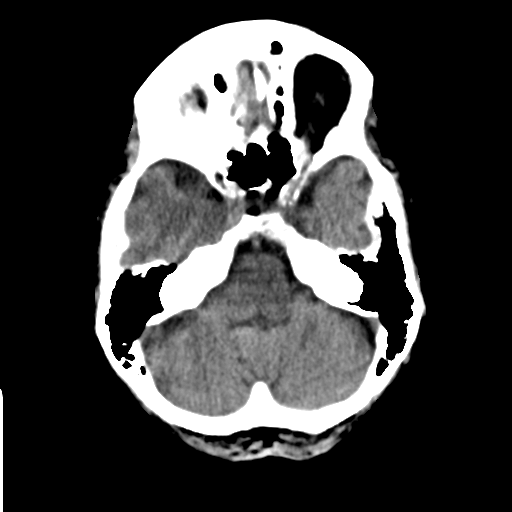
[im 10/27  brain]
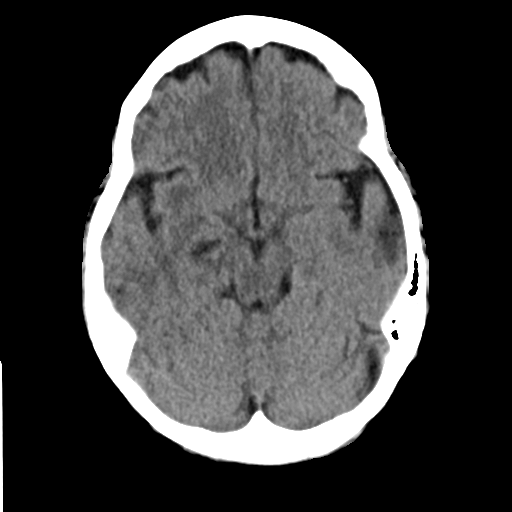
[im 14/27  brain]
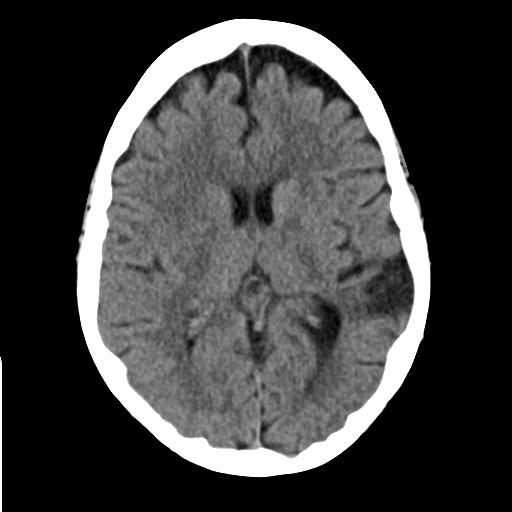
[im 17/27  brain]
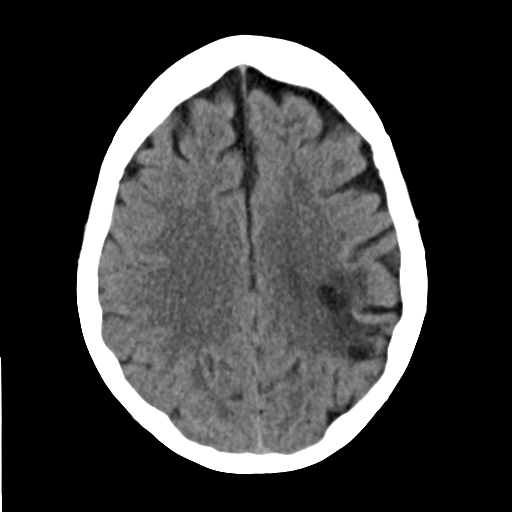
[im 17/27  bone]
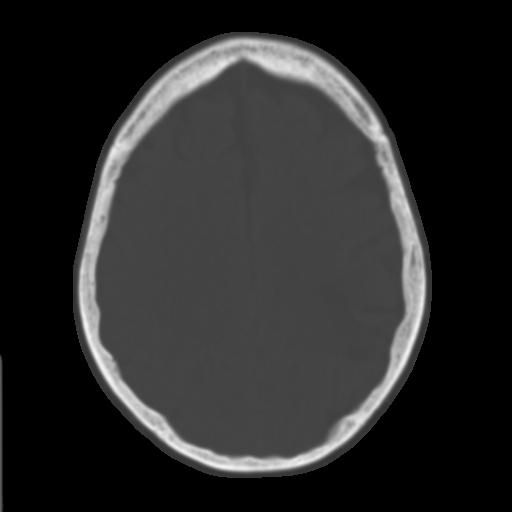
[im 20/27  brain]
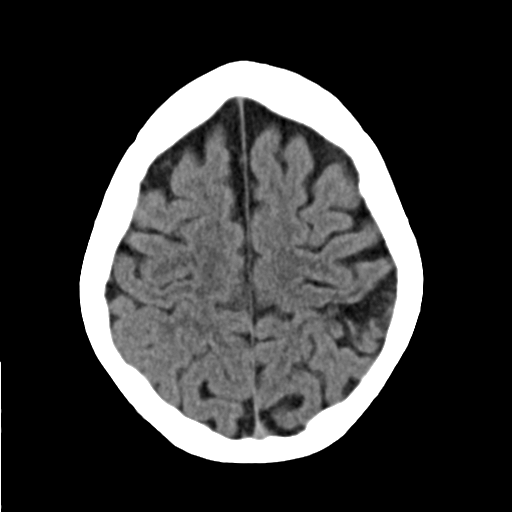
[im 23/27  brain]
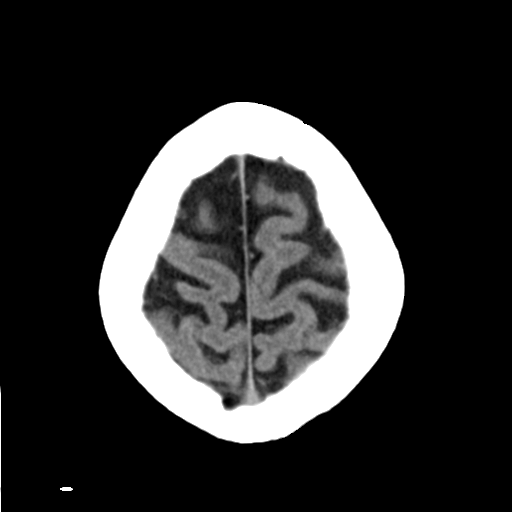

[Series 4: head bone · axial · 0.39mm/px · z∈[-106,-80]mm · 3 of 66 slices shown]
[im 7/66  bone]
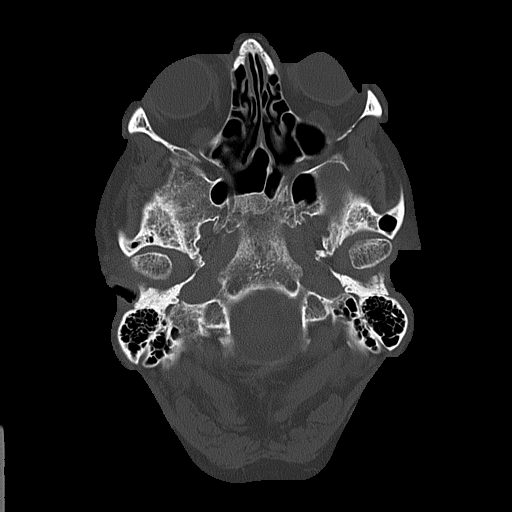
[im 14/66  bone]
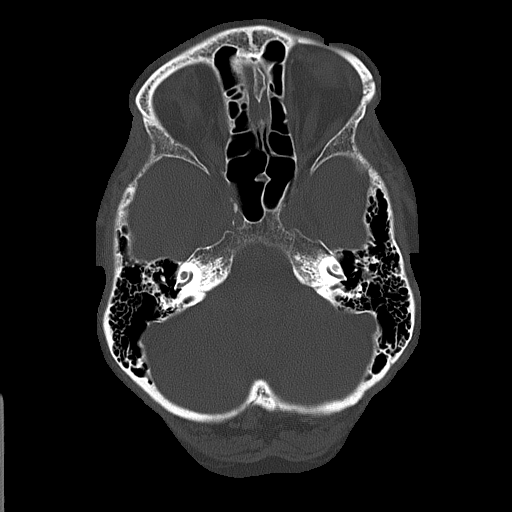
[im 20/66  bone]
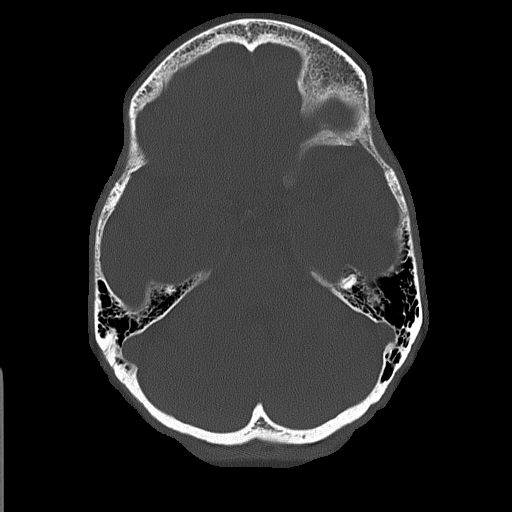

[Series 5: coronal soft tissue · coronal · 0.29mm/px · 3 of 63 slices shown]
[im 21/63  brain]
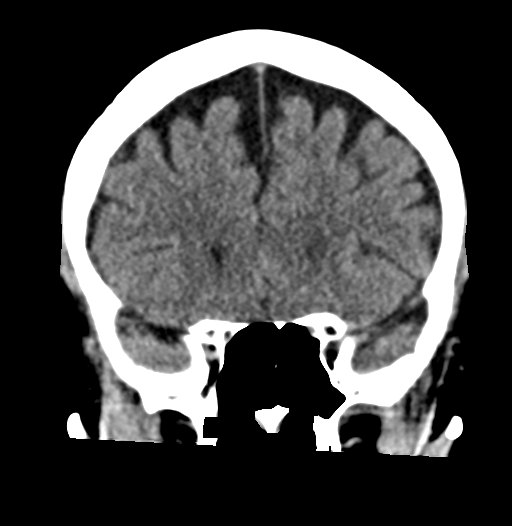
[im 28/63  brain]
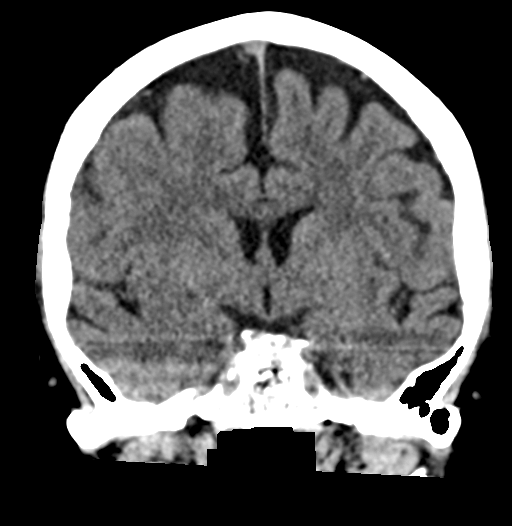
[im 35/63  brain]
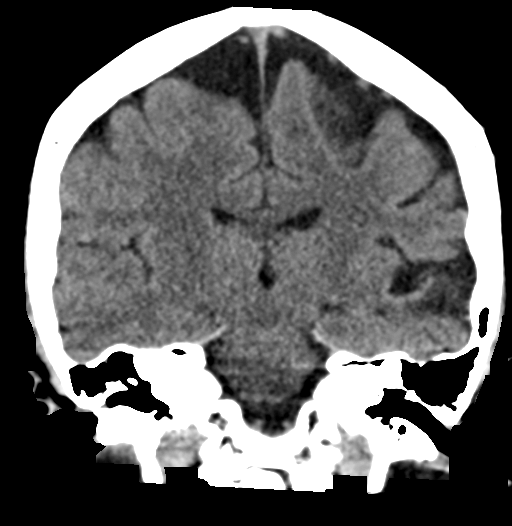

[Series 6: sagittal soft tissue · sagittal · 0.30mm/px · 3 of 50 slices shown]
[im 17/50  brain]
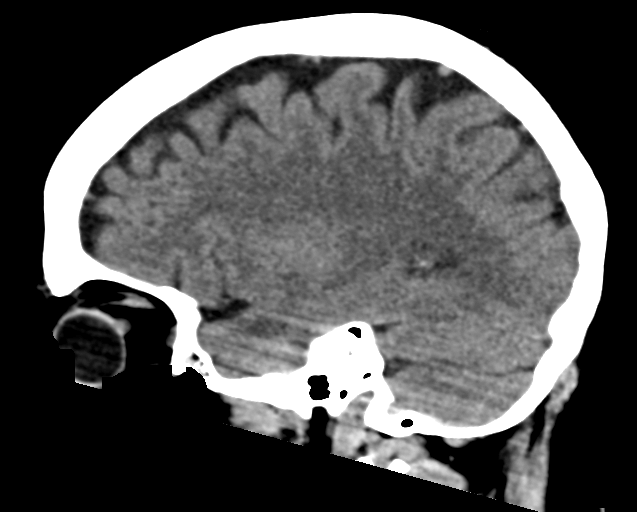
[im 25/50  brain]
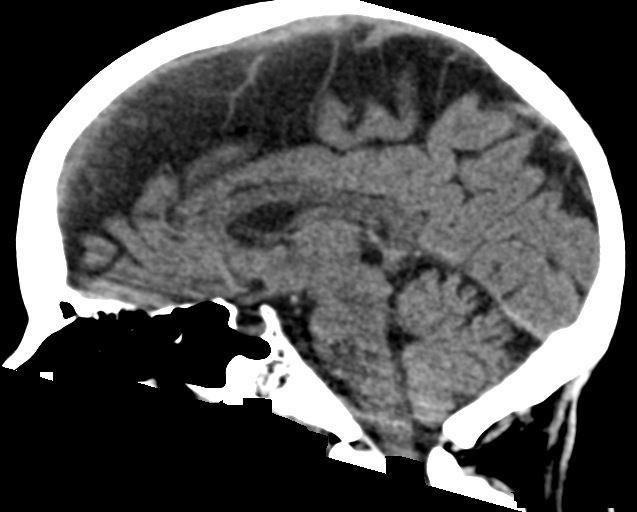
[im 33/50  brain]
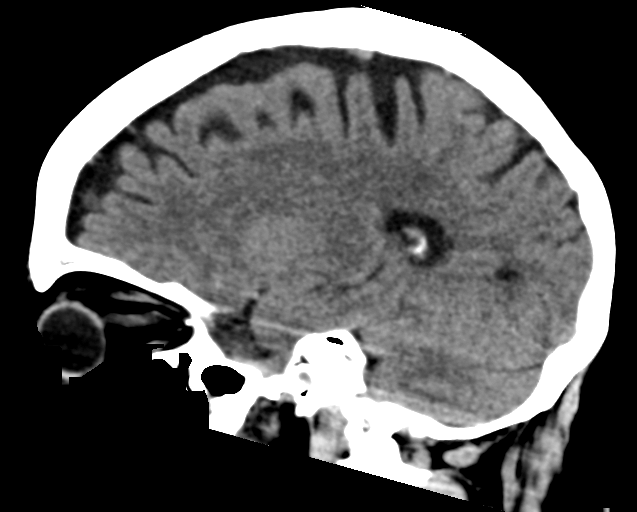

[16 of 47 positions shown; findings below may reference images not displayed]

FINDINGS: Brain: No evidence of acute infarction, hemorrhage, cerebral edema,
mass, mass effect, or midline shift. Ventricles and sulci are normal
for age. No extra-axial fluid collection. Encephalomalacia in the
left superior temporal, posterior frontal, and anterior temporal
lobes from prior left MCA territory infarct.

Vascular: No hyperdense vessel.

Skull: Negative for fracture or focal lesion.

Sinuses/Orbits: No acute finding.

Other: The mastoid air cells are well aerated.

ASPECTS (Alberta Stroke Program Early CT Score)

- Ganglionic level infarction (caudate, lentiform nuclei, internal
capsule, insula, M1-M3 cortex): 7

- Supraganglionic infarction (M4-M6 cortex): 3

Total score (0-10 with 10 being normal): 10
IMPRESSION: 1. No acute intracranial process.
2. ASPECTS is 10

pm to provider Dr. Anduen via telephone, who verbally acknowledged
these results.

## 2023-10-17 IMAGING — MR MR HEAD W/O CM
15 of 16 series · 40 of 48 positions shown · non-contrast
Comparison: 10/05/2019 MRI, correlation is also made with
01/11/2022 head CT

CLINICAL DATA: Right-sided numbness and weakness

EXAM:
MRI HEAD WITHOUT CONTRAST
TECHNIQUE: Multiplanar, multiecho pulse sequences of the brain and surrounding
structures were obtained without intravenous contrast.

[Series 5: ax dwi_tracew · axial · 3.0mm · 0.71mm/px · z∈[-60,+99]mm · 2 of 56 slices shown]
[im 1/56]
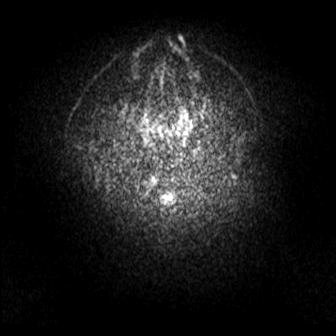
[im 56/56]
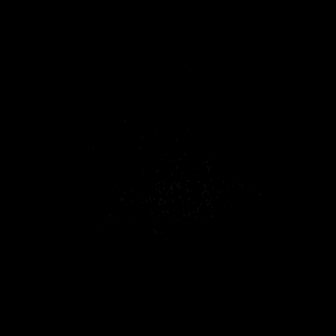

[Series 6: ax dwi_adc · axial · 3.0mm · 0.71mm/px · z∈[-60,+87]mm · 3 of 52 slices shown]
[im 1/52]
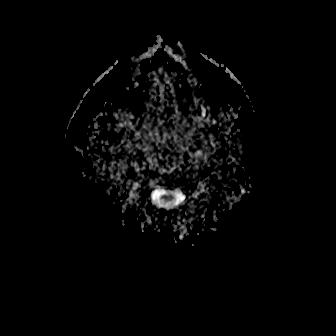
[im 26/52]
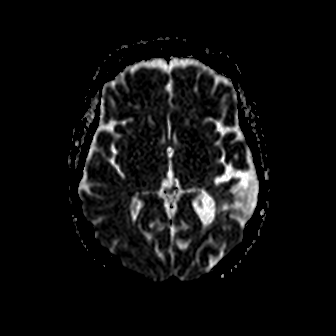
[im 52/52]
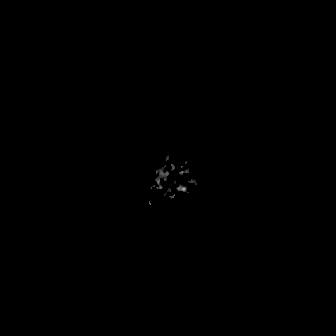

[Series 7: cor dwi_tracew · coronal · 5.0mm · 0.68mm/px · 2 of 40 slices shown]
[im 1/40]
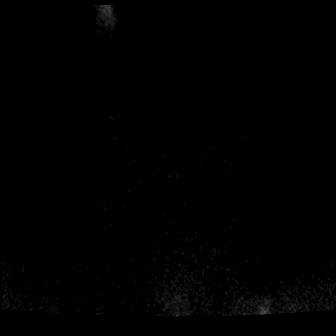
[im 40/40]
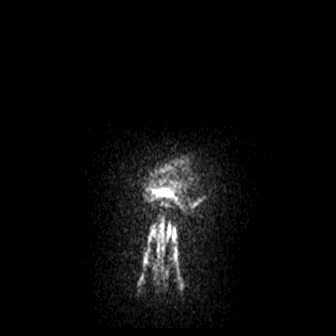

[Series 8: cor dwi_adc · coronal · 5.0mm · 0.68mm/px · 2 of 38 slices shown]
[im 1/38]
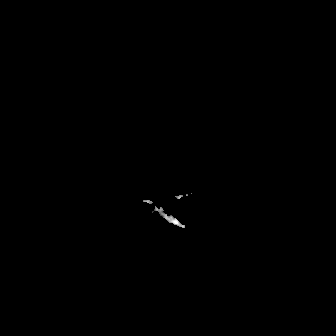
[im 38/38]
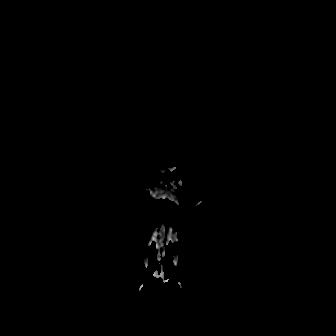

[Series 9: T1 · sagittal · 5.0mm · 0.47mm/px · 1 of 24 slices shown (1 of 2)]
[im 1/24]
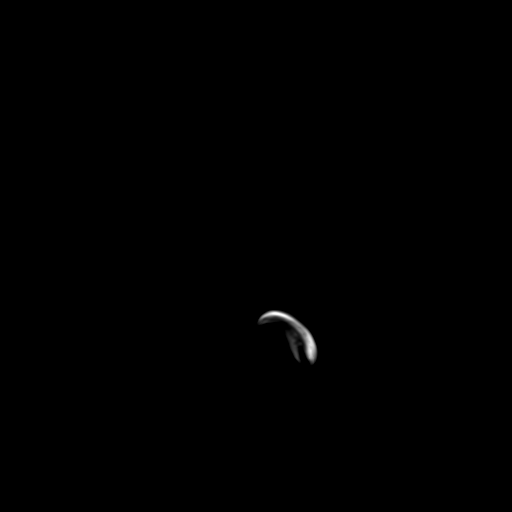

[Series 10: T2 · axial · 5.0mm · 0.86mm/px · 1 of 25 slices shown (1 of 3)]
[im 1/25]
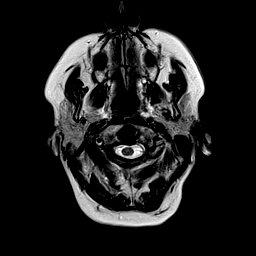

[Series 11: T2 · coronal · 3.0mm · 0.23mm/px · 2 of 35 slices shown (2 of 3)]
[im 1/35]
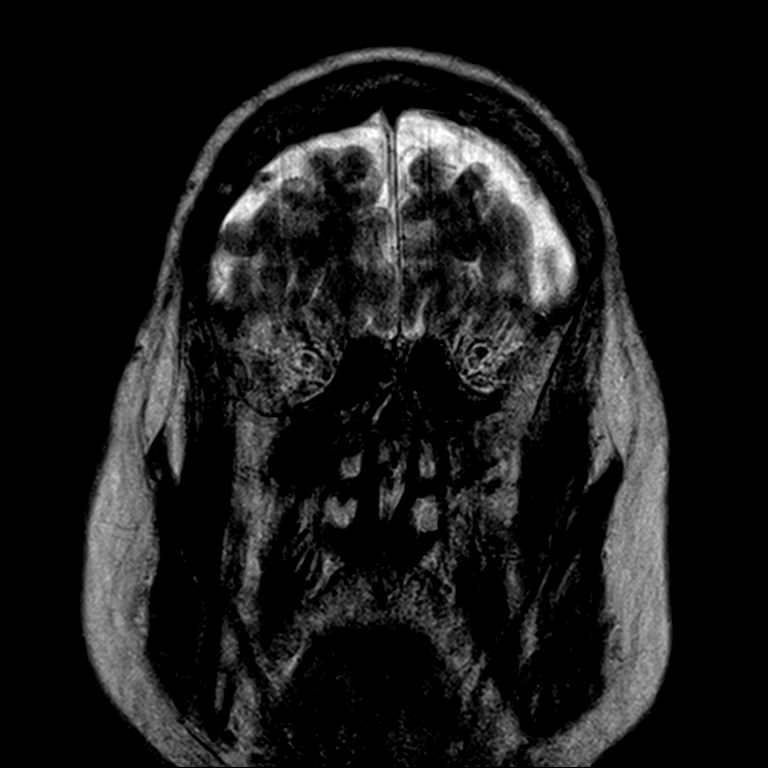
[im 35/35]
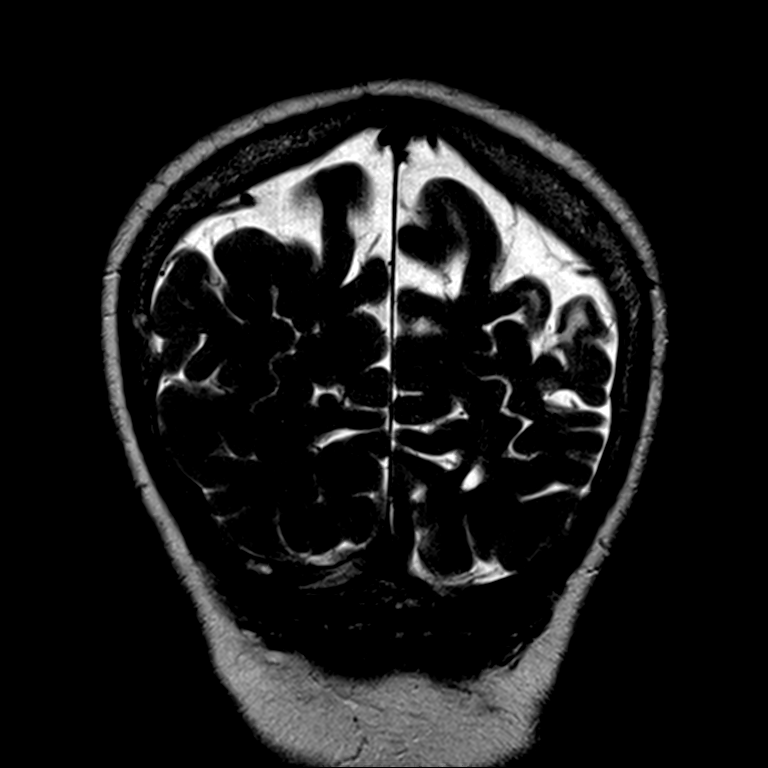

[Series 12: FLAIR · coronal · 3.0mm · 0.35mm/px · 2 of 35 slices shown (1 of 2)]
[im 1/35]
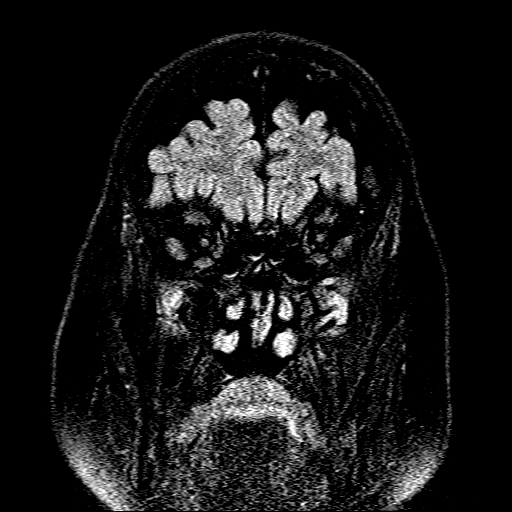
[im 35/35]
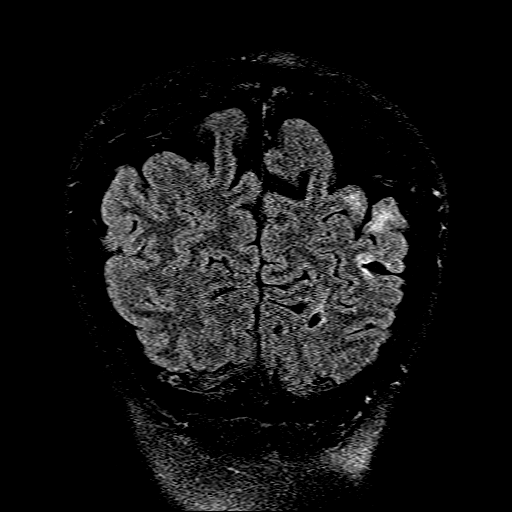

[Series 13: ax swi_mag · axial · 3.0mm · 0.90mm/px · z∈[-61,+97]mm · 3 of 56 slices shown]
[im 1/56]
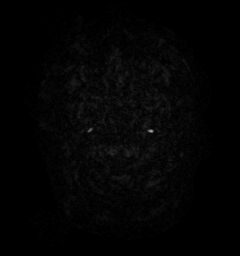
[im 28/56]
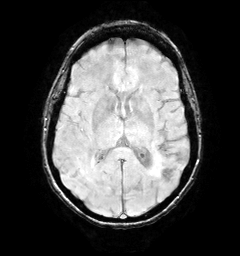
[im 56/56]
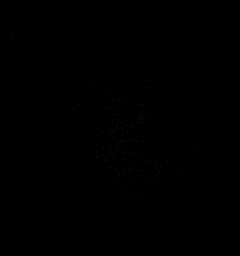

[Series 14: ax swi_pha · axial · 3.0mm · 0.90mm/px · z∈[-61,+97]mm · 3 of 56 slices shown]
[im 1/56]
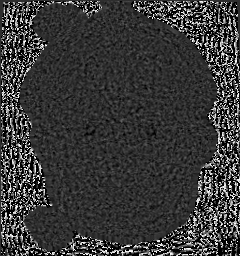
[im 28/56]
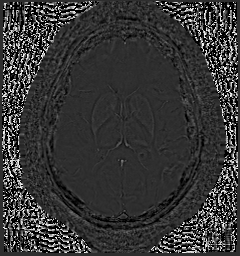
[im 56/56]
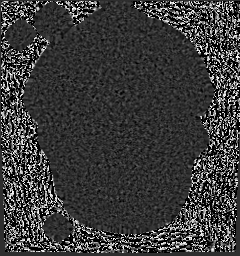

[Series 15: ax swi_swi · axial · 3.0mm · 0.90mm/px · z∈[-61,+97]mm · 3 of 56 slices shown]
[im 1/56]
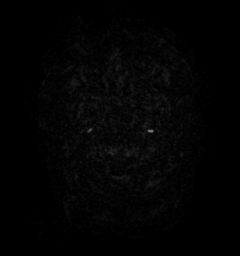
[im 28/56]
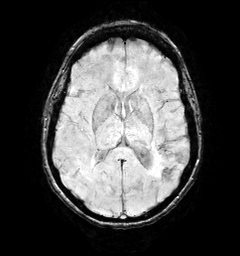
[im 56/56]
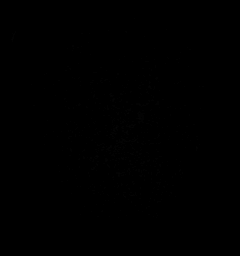

[Series 16: ax swi_swi_mip · axial · 24.0mm · 0.90mm/px · z∈[-51,+87]mm · 3 of 49 slices shown]
[im 1/49]
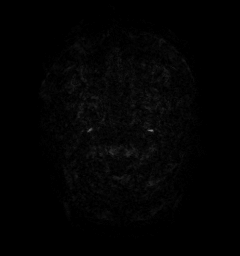
[im 25/49]
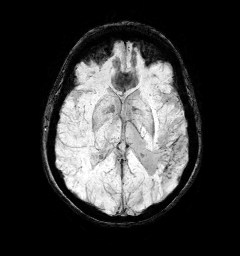
[im 49/49]
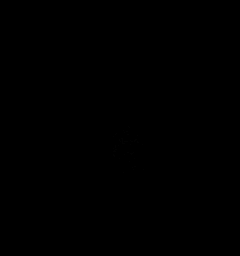

[Series 17: FLAIR · axial · 3.0mm · 0.69mm/px · z∈[-61,+91]mm · 3 of 54 slices shown (2 of 2)]
[im 1/54]
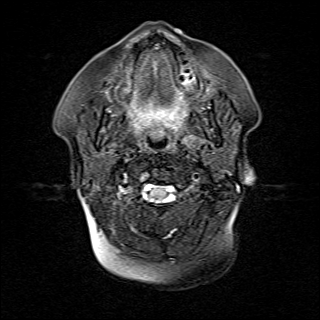
[im 27/54]
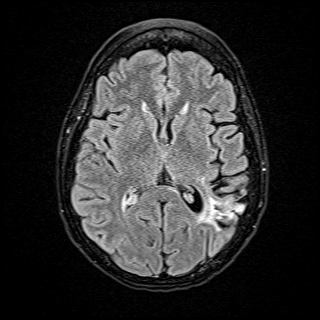
[im 54/54]
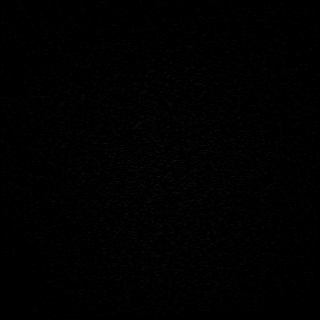

[Series 18: T1 · axial · 1.0mm · 0.98mm/px · z∈[-63,+105]mm · 8 of 176 slices shown (2 of 2)]
[im 1/176]
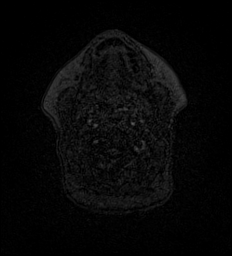
[im 22/176]
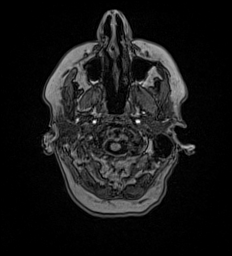
[im 44/176]
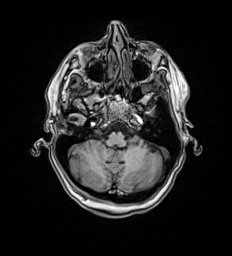
[im 66/176]
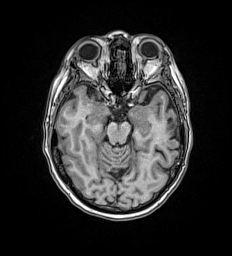
[im 110/176]
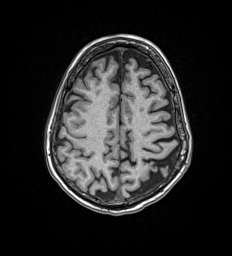
[im 132/176]
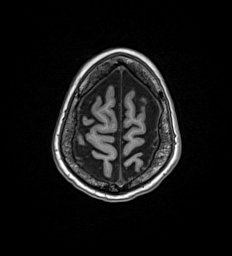
[im 154/176]
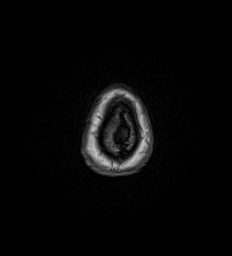
[im 176/176]
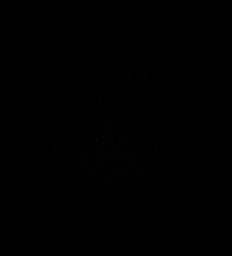

[Series 19: T2 · coronal · 5.0mm · 0.86mm/px · 2 of 30 slices shown (3 of 3)]
[im 1/30]
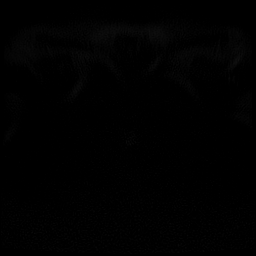
[im 30/30]
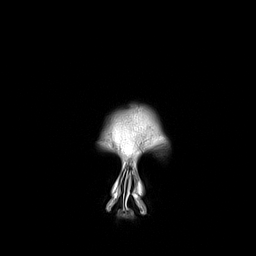

[40 of 48 positions shown; findings below may reference images not displayed]

FINDINGS: Brain: No restricted diffusion to suggest acute or subacute infarct.
No acute hemorrhage, mass, mass effect, or midline shift. No
hydrocephalus or extra-axial collection. Encephalomalacia in the
left temporal, posterior frontal, and anterior parietal lobes from
remote left MCA territory infarct. Scattered T2 hyperintense signal
in the periventricular white matter, likely the sequela of chronic
small vessel ischemic disease.

Vascular: Normal flow voids.

Skull and upper cervical spine: Normal marrow signal.

Sinuses/Orbits: No acute finding.

Other: The mastoids are well aerated.
IMPRESSION: No acute intracranial process.
